# Patient Record
Sex: Male | Born: 1985 | Race: Black or African American | Hispanic: No | Smoking: Never smoker
Health system: Southern US, Community
[De-identification: ages and names within clinical notes are randomized; demographics above are authoritative.]

## PROBLEM LIST (undated history)

## (undated) DIAGNOSIS — I472 Ventricular tachycardia, unspecified: Secondary | ICD-10-CM

## (undated) DIAGNOSIS — I509 Heart failure, unspecified: Secondary | ICD-10-CM

---
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
## 2012-03-20 ENCOUNTER — Emergency Department (HOSPITAL_COMMUNITY): Payer: Self-pay

## 2012-03-20 ENCOUNTER — Encounter (HOSPITAL_COMMUNITY)
Admission: EM | Disposition: A | Discharge status: Still In Hospital | Payer: Self-pay | Source: Home / Self Care | Attending: Emergency Medicine

## 2012-03-20 ENCOUNTER — Encounter (HOSPITAL_COMMUNITY): Payer: Self-pay | Admitting: Emergency Medicine

## 2012-03-20 ENCOUNTER — Emergency Department (HOSPITAL_COMMUNITY): Payer: Self-pay | Admitting: Anesthesiology

## 2012-03-20 ENCOUNTER — Encounter (HOSPITAL_COMMUNITY): Payer: Self-pay | Admitting: Anesthesiology

## 2012-03-20 ENCOUNTER — Emergency Department (HOSPITAL_COMMUNITY)
Admission: EM | Admit: 2012-03-20 | Discharge: 2012-03-20 | Disposition: A | Discharge status: Still In Hospital | Payer: Self-pay | Attending: Emergency Medicine | Admitting: Emergency Medicine

## 2012-03-20 DIAGNOSIS — S62639B Displaced fracture of distal phalanx of unspecified finger, initial encounter for open fracture: Secondary | ICD-10-CM | POA: Insufficient documentation

## 2012-03-20 DIAGNOSIS — IMO0002 Reserved for concepts with insufficient information to code with codable children: Secondary | ICD-10-CM | POA: Insufficient documentation

## 2012-03-20 DIAGNOSIS — W540XXA Bitten by dog, initial encounter: Secondary | ICD-10-CM | POA: Insufficient documentation

## 2012-03-20 HISTORY — PX: I&D EXTREMITY: SHX5045

## 2012-03-20 HISTORY — PX: AMPUTATION: SHX166

## 2012-03-20 SURGERY — AMPUTATION DIGIT
Anesthesia: General | Site: Hand | Laterality: Left | Wound class: Contaminated

## 2012-03-20 MED ORDER — SODIUM CHLORIDE 0.9 % IV SOLN
3.0000 g | Freq: Once | INTRAVENOUS | Status: AC
  Administered 2012-03-20: 3 g via INTRAVENOUS
  Filled 2012-03-20: qty 3

## 2012-03-20 MED ORDER — PROMETHAZINE HCL 25 MG/ML IJ SOLN: 6.2500 mg | INTRAMUSCULAR | Status: DC | PRN

## 2012-03-20 MED ORDER — HYDROMORPHONE HCL PF 1 MG/ML IJ SOLN: 0.2500 mg | INTRAMUSCULAR | Status: DC | PRN

## 2012-03-20 MED ORDER — MIDAZOLAM HCL 5 MG/5ML IJ SOLN
INTRAMUSCULAR | Status: DC | PRN
  Administered 2012-03-20: 2 mg via INTRAVENOUS

## 2012-03-20 MED ORDER — FENTANYL CITRATE 0.05 MG/ML IJ SOLN
INTRAMUSCULAR | Status: DC | PRN
  Administered 2012-03-20: 50 ug via INTRAVENOUS
  Administered 2012-03-20: 100 ug via INTRAVENOUS

## 2012-03-20 MED ORDER — OXYCODONE HCL 5 MG/5ML PO SOLN: 5.0000 mg | Freq: Once | ORAL | Status: DC | PRN

## 2012-03-20 MED ORDER — OXYCODONE-ACETAMINOPHEN 5-325 MG PO TABS: ORAL_TABLET | ORAL | Status: DC

## 2012-03-20 MED ORDER — OXYCODONE HCL 5 MG PO TABS: 5.0000 mg | ORAL_TABLET | Freq: Once | ORAL | Status: DC | PRN

## 2012-03-20 MED ORDER — SODIUM CHLORIDE 0.9 % IR SOLN
Status: DC | PRN
  Administered 2012-03-20: 2000 mL

## 2012-03-20 MED ORDER — SODIUM CHLORIDE 0.9 % IV SOLN
Freq: Once | INTRAVENOUS | Status: AC
  Administered 2012-03-20: 10:00:00 via INTRAVENOUS

## 2012-03-20 MED ORDER — PROPOFOL 10 MG/ML IV BOLUS
INTRAVENOUS | Status: DC | PRN
  Administered 2012-03-20: 300 mg via INTRAVENOUS

## 2012-03-20 MED ORDER — LACTATED RINGERS IV SOLN: INTRAVENOUS | Status: DC

## 2012-03-20 MED ORDER — LIDOCAINE HCL (CARDIAC) 20 MG/ML IV SOLN
INTRAVENOUS | Status: DC | PRN
  Administered 2012-03-20: 100 mg via INTRAVENOUS

## 2012-03-20 MED ORDER — MEPERIDINE HCL 25 MG/ML IJ SOLN: 6.2500 mg | INTRAMUSCULAR | Status: DC | PRN

## 2012-03-20 MED ORDER — BUPIVACAINE HCL 0.25 % IJ SOLN
10.0000 mL | Freq: Once | INTRAMUSCULAR | Status: DC
  Filled 2012-03-20: qty 10

## 2012-03-20 MED ORDER — LACTATED RINGERS IV SOLN
INTRAVENOUS | Status: DC | PRN
  Administered 2012-03-20: 12:00:00 via INTRAVENOUS

## 2012-03-20 MED ORDER — MIDAZOLAM HCL 2 MG/2ML IJ SOLN: 0.5000 mg | Freq: Once | INTRAMUSCULAR | Status: DC | PRN

## 2012-03-20 MED ORDER — ONDANSETRON 4 MG PO TBDP
ORAL_TABLET | ORAL | Status: AC
  Filled 2012-03-20: qty 2

## 2012-03-20 MED ORDER — AMOXICILLIN-POT CLAVULANATE 875-125 MG PO TABS: 1.0000 | ORAL_TABLET | Freq: Two times a day (BID) | ORAL | Status: DC

## 2012-03-20 MED ORDER — BUPIVACAINE HCL 0.25 % IJ SOLN
INTRAMUSCULAR | Status: DC | PRN
  Administered 2012-03-20: 10 mL

## 2012-03-20 MED ORDER — ONDANSETRON 4 MG PO TBDP
8.0000 mg | ORAL_TABLET | Freq: Once | ORAL | Status: AC
  Administered 2012-03-20: 8 mg via ORAL

## 2012-03-20 MED ORDER — ONDANSETRON HCL 4 MG/2ML IJ SOLN
INTRAMUSCULAR | Status: DC | PRN
  Administered 2012-03-20: 4 mg via INTRAVENOUS

## 2012-03-20 SURGICAL SUPPLY — 67 items
BANDAGE COBAN STERILE 2 (GAUZE/BANDAGES/DRESSINGS) IMPLANT
BANDAGE CONFORM 2  STR LF (GAUZE/BANDAGES/DRESSINGS) ×2 IMPLANT
BANDAGE ELASTIC 3 VELCRO ST LF (GAUZE/BANDAGES/DRESSINGS) ×2 IMPLANT
BANDAGE ELASTIC 4 VELCRO ST LF (GAUZE/BANDAGES/DRESSINGS) ×2 IMPLANT
BANDAGE GAUZE ELAST BULKY 4 IN (GAUZE/BANDAGES/DRESSINGS) ×2 IMPLANT
BLADE LONG MED 31X9 (MISCELLANEOUS) ×2 IMPLANT
BNDG COHESIVE 1X5 TAN STRL LF (GAUZE/BANDAGES/DRESSINGS) ×2 IMPLANT
BNDG COHESIVE 3X5 TAN STRL LF (GAUZE/BANDAGES/DRESSINGS) IMPLANT
BNDG ESMARK 4X9 LF (GAUZE/BANDAGES/DRESSINGS) ×2 IMPLANT
CLOTH BEACON ORANGE TIMEOUT ST (SAFETY) ×2 IMPLANT
CORDS BIPOLAR (ELECTRODE) ×2 IMPLANT
COVER SURGICAL LIGHT HANDLE (MISCELLANEOUS) ×2 IMPLANT
CUFF TOURNIQUET SINGLE 18IN (TOURNIQUET CUFF) ×2 IMPLANT
CUFF TOURNIQUET SINGLE 24IN (TOURNIQUET CUFF) IMPLANT
DECANTER SPIKE VIAL GLASS SM (MISCELLANEOUS) ×2 IMPLANT
DRAIN PENROSE 1/4X12 LTX STRL (WOUND CARE) IMPLANT
DRSG ADAPTIC 3X8 NADH LF (GAUZE/BANDAGES/DRESSINGS) IMPLANT
DRSG EMULSION OIL 3X3 NADH (GAUZE/BANDAGES/DRESSINGS) ×2 IMPLANT
DRSG KUZMA FLUFF (GAUZE/BANDAGES/DRESSINGS) IMPLANT
DRSG PAD ABDOMINAL 8X10 ST (GAUZE/BANDAGES/DRESSINGS) ×4 IMPLANT
DURAPREP 26ML APPLICATOR (WOUND CARE) ×2 IMPLANT
GAUZE XEROFORM 1X8 LF (GAUZE/BANDAGES/DRESSINGS) ×2 IMPLANT
GLOVE BIO SURGEON STRL SZ 6.5 (GLOVE) ×2 IMPLANT
GLOVE BIO SURGEON STRL SZ7.5 (GLOVE) ×2 IMPLANT
GLOVE BIOGEL PI IND STRL 8 (GLOVE) ×1 IMPLANT
GLOVE BIOGEL PI INDICATOR 8 (GLOVE) ×1
GLOVE SURG ORTHO 8.0 STRL STRW (GLOVE) ×2 IMPLANT
GOWN BRE IMP PREV XXLGXLNG (GOWN DISPOSABLE) ×2 IMPLANT
GOWN PREVENTION PLUS XLARGE (GOWN DISPOSABLE) ×2 IMPLANT
GOWN STRL NON-REIN LRG LVL3 (GOWN DISPOSABLE) ×4 IMPLANT
HANDPIECE INTERPULSE COAX TIP (DISPOSABLE)
KIT BASIN OR (CUSTOM PROCEDURE TRAY) ×2 IMPLANT
KIT ROOM TURNOVER OR (KITS) ×2 IMPLANT
LOOP VESSEL MAXI BLUE (MISCELLANEOUS) IMPLANT
LOOP VESSEL MINI RED (MISCELLANEOUS) ×2 IMPLANT
MANIFOLD NEPTUNE II (INSTRUMENTS) ×2 IMPLANT
NEEDLE HYPO 25GX1X1/2 BEV (NEEDLE) ×2 IMPLANT
NEEDLE HYPO 25X1 1.5 SAFETY (NEEDLE) IMPLANT
NS IRRIG 1000ML POUR BTL (IV SOLUTION) ×2 IMPLANT
PACK ORTHO EXTREMITY (CUSTOM PROCEDURE TRAY) ×2 IMPLANT
PAD ARMBOARD 7.5X6 YLW CONV (MISCELLANEOUS) ×4 IMPLANT
PAD CAST 4YDX4 CTTN HI CHSV (CAST SUPPLIES) IMPLANT
PADDING CAST COTTON 4X4 STRL (CAST SUPPLIES)
RUBBERBAND STERILE (MISCELLANEOUS) ×2 IMPLANT
SCRUB BETADINE 4OZ XXX (MISCELLANEOUS) ×2 IMPLANT
SET HNDPC FAN SPRY TIP SCT (DISPOSABLE) IMPLANT
SOLUTION BETADINE 4OZ (MISCELLANEOUS) ×2 IMPLANT
SPECIMEN JAR SMALL (MISCELLANEOUS) ×2 IMPLANT
SPONGE GAUZE 4X4 12PLY (GAUZE/BANDAGES/DRESSINGS) ×2 IMPLANT
SPONGE LAP 18X18 X RAY DECT (DISPOSABLE) ×2 IMPLANT
SPONGE LAP 4X18 X RAY DECT (DISPOSABLE) ×2 IMPLANT
SPONGE SCRUB IODOPHOR (GAUZE/BANDAGES/DRESSINGS) ×2 IMPLANT
SUCTION FRAZIER TIP 10 FR DISP (SUCTIONS) ×2 IMPLANT
SUT ETHILON 4 0 PS 2 18 (SUTURE) ×2 IMPLANT
SUT ETHILON 5 0 PS 2 18 (SUTURE) IMPLANT
SUT MON AB 5-0 P3 18 (SUTURE) IMPLANT
SUT SILK 4 0 PS 2 (SUTURE) IMPLANT
SUT VICRYL 4-0 PS2 18IN ABS (SUTURE) IMPLANT
SYR CONTROL 10ML LL (SYRINGE) ×2 IMPLANT
TOWEL OR 17X24 6PK STRL BLUE (TOWEL DISPOSABLE) ×2 IMPLANT
TOWEL OR 17X26 10 PK STRL BLUE (TOWEL DISPOSABLE) ×2 IMPLANT
TUBE ANAEROBIC SPECIMEN COL (MISCELLANEOUS) IMPLANT
TUBE CONNECTING 12X1/4 (SUCTIONS) ×2 IMPLANT
TUBE FEEDING 5FR 15 INCH (TUBING) IMPLANT
UNDERPAD 30X30 INCONTINENT (UNDERPADS AND DIAPERS) ×2 IMPLANT
WATER STERILE IRR 1000ML POUR (IV SOLUTION) ×2 IMPLANT
YANKAUER SUCT BULB TIP NO VENT (SUCTIONS) ×2 IMPLANT

## 2012-03-20 NOTE — Transfer of Care (Signed)
Immediate Anesthesia Transfer of Care Note  Patient: Dakota Weber  Procedure(s) Performed: Procedure(s): IRRIGATION AND DEBRIDEMENT REVISION AMPUTATION LEFT FINGER (Left) IRRIGATION LEFT LONG FINGER (Left)  Patient Location: PACU  Anesthesia Type:General  Level of Consciousness: awake, alert  and oriented  Airway & Oxygen Therapy: Patient Spontanous Breathing  Post-op Assessment: Report given to PACU RN and Post -op Vital signs reviewed and stable  Post vital signs: Reviewed and stable  Complications: No apparent anesthesia complications

## 2012-03-20 NOTE — ED Provider Notes (Signed)
History     CSN: [id number]  Arrival date & time 03/20/12  4098   First MD Initiated Contact with Patient 03/20/12 [id number]      Chief Complaint  Patient presents with  . Finger Injury    (Consider location/radiation/quality/duration/timing/severity/associated sxs/prior treatment) HPI Dakota Weber is a 27 y.o. male who presents to ED with complaint of left index finger injury. States finger was bit by a dog. States knows who dog belongs to, and it is in possession. Pt states his tetanus has been within 10 years. Tip of the finger amputated by the bite, here with pt on ice. Pt denies any other injuries. States pain in the finger. Bleeding controlled. States feels dizzy and nauseated.    History reviewed. No pertinent past medical history.  History reviewed. No pertinent past surgical history.  No family history on file.  History  Substance Use Topics  . Smoking status: Not on file  . Smokeless tobacco: Not on file  . Alcohol Use: No      Review of Systems  Constitutional: Negative for fever and chills.  Skin: Positive for wound.  Hematological: Does not bruise/bleed easily.  All other systems reviewed and are negative.    Allergies  Review of patient's allergies indicates no known allergies.  Home Medications  No current outpatient prescriptions on file.  BP 136/83  Pulse 89  Temp(Src) 97.5 F (36.4 C) (Oral)  Resp 18  SpO2 100%  Physical Exam  Nursing note and vitals reviewed. Constitutional: He appears well-developed and well-nourished. No distress.  Eyes: Conjunctivae are normal.  Neck: Neck supple.  Musculoskeletal:  Amputation to the distal left index finger, just proximal to the finger nail. Bleeding controlled with pressure. Left middle finger puncture wound through the finger nail with a small paraungual laceration.  Neurological: He is alert.  Skin: Skin is warm and dry.    ED Course  Procedures (including critical care time)  NERVE  BLOCK Performed by: Jaynie Crumble A Consent: Verbal consent obtained. Required items: required blood products, implants, devices, and special equipment available Time out: Immediately prior to procedure a "time out" was called to verify the correct patient, procedure, equipment, support staff and site/side marked as required.  Indication: partial amputation, pain management Nerve block body site: left index finger  Preparation: Patient was prepped and draped in the usual sterile fashion. Needle gauge: 24 G Location technique: anatomical landmarks  Local anesthetic: lidocaine 2% wo epi  Anesthetic total: 4 ml  Outcome: pain improved Patient tolerance: Patient tolerated the procedure well with no immediate complications.   10:50 AM Pt seen by Dr. Merlyn Lot, will take to OR at 12:30. Pt's pain controlled at present. Bleeding controlled with pressure dressing.   1. Traumatic amputation of finger(s), complicated, initial encounter   2. Dog bite, initial encounter       MDM  PT with traumatic dog bite amputation of the distal phalanx of left index finger and a wound to the distal left middle finger. Pt's tetanus within last 10 years. Dog in possession per family, no rabies started. Pt given unasyn in ED.  Pt will be taken to OR by Dr. Merlyn Lot.         Lottie Mussel, PA-C 03/20/12 1054

## 2012-03-20 NOTE — Brief Op Note (Signed)
03/20/2012  1:33 PM  PATIENT:  Dakota Weber  27 y.o. male  PRE-OPERATIVE DIAGNOSIS:  dog bite  POST-OPERATIVE DIAGNOSIS:  dog bite  PROCEDURE:  Procedure(s): IRRIGATION AND DEBRIDEMENT REVISION AMPUTATION LEFT FINGER (Left) IRRIGATION LEFT LONG FINGER (Left)  SURGEON:  Surgeon(s) and Role:    * Tami Ribas, MD - Primary  PHYSICIAN ASSISTANT:   ASSISTANTS: none   ANESTHESIA:   general  EBL:     BLOOD ADMINISTERED:none  DRAINS: vessel loop drains  LOCAL MEDICATIONS USED:  MARCAINE     SPECIMEN:  No Specimen  DISPOSITION OF SPECIMEN:  N/A  COUNTS:  YES  TOURNIQUET:   Total Tourniquet Time Documented: Upper Arm (Left) - 38 minutes Total: Upper Arm (Left) - 38 minutes   DICTATION: .Other Dictation: Dictation Number [id number]  PLAN OF CARE: Discharge to home after PACU  PATIENT DISPOSITION:  PACU - hemodynamically stable.

## 2012-03-20 NOTE — H&P (Signed)
  Dakota Weber is an 27 y.o. male.   Chief Complaint: dog bite left hand HPI: 27 yo male states he was bitten by a neighbors dog this morning.  Unsure if dog has had its shots.  Suffered loss of tip of left index finger and wound to distal phalanx of long finger.  Reports no other injuries.  Pain controlled.  NPO today.  History reviewed. No pertinent past medical history.  History reviewed. No pertinent past surgical history.  No family history on file. Social History:  reports that he does not drink alcohol or use illicit drugs. His tobacco history is not on file.  Allergies: No Known Allergies   (Not in a hospital admission)  No results found for this or any previous visit (from the past 48 hour(s)).  Dg Finger Index Left  03/20/2012  *RADIOLOGY REPORT*  Clinical Data: Distal aspect of the left index finger and off by dog today.  LEFT INDEX FINGER 2+V  Comparison: No priors.  Findings: There is traumatic amputation of the distal aspect of the distal phalanx of the left second finger.  Overlying soft tissues are also missing, and there are several small bony fragments in the overlying soft tissues.   The finger is otherwise intact.  IMPRESSION: 1.  Open comminuted fracture/amputation of the distal aspect of the left second distal phalanx.   Original Report Authenticated By: Trudie Reed, M.D.      A comprehensive review of systems was negative.  Blood pressure 110/86, pulse 65, temperature 97.5 F (36.4 C), temperature source Oral, resp. rate 20, SpO2 100.00%.  General appearance: alert, cooperative and appears stated age Head: Normocephalic, without obvious abnormality, atraumatic Neck: supple, symmetrical, trachea midline Resp: clear to auscultation bilaterally Cardio: regular rate and rhythm GI: non tender Extremities: intact sensation and capillary refill all digits.  +epl/fpl/io.  right UE: no wounds or ttp.  left UE: amputation of tip of index through mid nail.  wound at  radial side of distal phalanx of long finger.  nail intact.  swelling in pad of finger.  no other wounds. Pulses: 2+ and symmetric Skin: as above Neurologic: Grossly normal Incision/Wound: As above  Assessment/Plan Left index amputation and long finger wound from dog bite.  Recommend OR for I&D and revision amputation left index and I&D of long finger.  Risks, benefits, and alternatives of surgery were discussed and the patient agrees with the plan of care.   KUZMA,KEVIN R 03/20/2012, 11:10 AM

## 2012-03-20 NOTE — ED Notes (Signed)
Pt. Stated, I was bitten by a dog trying to retrieve my own dog .  Bite the tip end of my lt. Index finger.

## 2012-03-20 NOTE — Preoperative (Signed)
Beta Blockers   Reason not to administer Beta Blockers:Not Applicable 

## 2012-03-20 NOTE — Anesthesia Procedure Notes (Signed)
Procedure Name: LMA Insertion Date/Time: 03/20/2012 12:31 PM Performed by: Marena Chancy Pre-anesthesia Checklist: Timeout performed, Patient identified, Emergency Drugs available, Suction available and Patient being monitored Patient Re-evaluated:Patient Re-evaluated prior to inductionOxygen Delivery Method: Circle system utilized Preoxygenation: Pre-oxygenation with 100% oxygen Intubation Type: IV induction LMA: LMA inserted LMA Size: 5.0 Number of attempts: 1 Tube secured with: Tape Dental Injury: Teeth and Oropharynx as per pre-operative assessment

## 2012-03-20 NOTE — ED Provider Notes (Signed)
Medical screening examination/treatment/procedure(s) were conducted as a shared visit with non-physician practitioner(s) and myself.  I personally evaluated the patient during the encounter.  Open Distal phalanx tuft fracture with fingertip amputation from dog bite; hand surgery will see the patient in ED.  Dog known on chain and can be quarantined.  Hurman Horn, MD 03/20/12 2118

## 2012-03-20 NOTE — ED Notes (Signed)
Patient transported to X-ray 

## 2012-03-20 NOTE — ED Notes (Signed)
Pt transported to OR

## 2012-03-20 NOTE — Op Note (Signed)
[id number] 

## 2012-03-20 NOTE — ED Notes (Signed)
Per Registration, dog bite has been reported. Pt is waiting for surgery around 1230. Consent has been signed. Pt is resting in room, using  Cell phone/watching TV

## 2012-03-21 NOTE — Op Note (Signed)
NAMEDIGBY, GROENEVELD NO.:  [id number]  MEDICAL RECORD NO.:  [id number]  LOCATION:  [facility]                         FACILITY:  [facility]  PHYSICIAN:  Betha Loa, MD        DATE OF BIRTH:  [birth year]  DATE OF PROCEDURE:  03/20/2012 DATE OF DISCHARGE:  03/20/2012                              OPERATIVE REPORT   PREOPERATIVE DIAGNOSIS:  Left index finger amputation and long finger wound due to dog bite.  POSTOPERATIVE DIAGNOSIS:  Left index finger amputation due to dog bite and long finger nail bed laceration due to dog bite.  PROCEDURES:  Left index finger irrigation and debridement and revision amputation, left long finger irrigation and debridement, and repair of nail bed.  SURGEON:  Betha Loa, MD  ASSISTANT:  None.  ANESTHESIA:  General.  IV FLUIDS:  Per anesthesia flow sheet.  ESTIMATED BLOOD LOSS:  Minimal.  COMPLICATIONS:  None.  SPECIMENS:  None.  TOURNIQUET TIME:  38 minutes.  DISPOSITION:  Stable to PACU.  INDICATIONS:  Mr. Ferrell is a 27 year old male who states this morning while he was trying to get his dog in from outside, a dog that was chained up bit him on his left hand.  This caused amputation of the tip of the index finger and small wound on the long finger.  He presented to [facility].  I was consulted for management injury. On evaluation, he had complete amputation of the tip of the index finger through the midportion of the nail.  There was a small laceration of the cuticle at the radial side of the nail of the long finger.  I recommended Mr. Hohn going to the operating room for irrigation and debridement, revision amputation, and closure of the wounds.  Risks, benefits, and alternatives of surgery were discussed including risk of blood loss, infection, damage to nerves, vessels, tendons, ligaments, bone; failure of surgery; need for additional surgery, complications with wound healing, continued pain,  infection, and need for repeat irrigation and debridement.  He voiced understanding of these risks and elected to proceed.  OPERATIVE COURSE:  After being identified preoperatively by myself, the patient and I agreed upon procedure and site procedure.  The surgical site was marked.  The risks, benefits, and alternatives of surgery were reviewed and he wished to proceed.  Surgical consent had been signed. He was given IV Unasyn in the emergency department for coverage of the wound.  His tetanus was within the past 6 years.  He was transferred to the operating room and placed on the operating room table in supine position with the left upper extremity on arm board.  General anesthesia was induced by the anesthesiologist.  Left upper extremity was prepped and draped in normal sterile orthopedic fashion.  Surgical pause was performed between surgeons, anesthesia, operating staff, and all were in agreement as to the patient, procedure, and site of procedure. Tourniquet at the proximal aspect of the extremity was inflated to 250 mmHg after exsanguination of the limb with an Esmarch bandage. Attention was turned to the index finger 1st.  There was no gross contamination.  There was some comminution to the distal phalanx.  The  wound was copiously irrigated with 1000 mL of sterile saline by bulb syringe.  There was a split of the nail bed.  The nail had avulsed due to the traumatic injury.  The comminuted portion of the distal phalanx was on the ulnar side and this was removed.  The soft tissues were freed up using the scalpel.  A 5-0 Monocryl suture was used to approximate the soft tissues over the end of the bone.  A 6-0 chromic suture was used to repair the nail bed laceration proximally the fatty soft tissues up into the nail bed tissue.  Vessel loop drains were placed in the wounds to provide adequate drainage since this was a dog bite injury.  Attention was then turned to the long finger.   The wound on the side of the cuticle was an abrasion mostly.  The nail fold was lifted and there was no wound underneath.  There was a hematoma underneath the nail, but had enlarged since his initial evaluation.  The nail was then removed with the Therapist, nutritional.  There was a laceration of the nail bed on the radial side.  This was a longitudinal with a T-portion distally.  This laceration was copiously irrigated with sterile saline.  A relaxing incision had been made in the radial side of the cuticle as well.  The 6- 0 chromic suture was used to repair the nail bed laceration.  This approximated the nail bed tissues well.  The chromic sutures were also used to repair the relaxing incision in the cuticle.  Xeroform was placed in the nail fold and all wounds were dressed with sterile Xeroform, 4x4s, and wrapped with a Coban dressing lightly.  An AlumaFoam splint was placed on the index finger and wrapped with a Coban dressing lightly.  Digital block was performed to the index and long finger using 10 mL of 0.25% plain Marcaine to aid in postoperative analgesia.  The tourniquet was deflated at 38 minutes.  The operative drapes were broken down.  The patient was awoken from anesthesia safely.  He was transferred back to stretcher and taken to PACU in stable condition.  I will see him back in the office in 4 days for postoperative followup.  I will give him Percocet 5/325, 1-2 p.o. q.6 hours p.r.n. pain, dispensed #40; and Augmentin 875 mg p.o. b.i.d. x7 days.     Betha Loa, MD     KK/MEDQ  D:  03/20/2012  T:  03/21/2012  Job:  [id number]

## 2012-03-21 NOTE — Anesthesia Preprocedure Evaluation (Addendum)
Anesthesia Evaluation  Patient identified by MRN, date of birth, ID band Patient awake    Reviewed: Allergy & Precautions, H&P , NPO status , Patient's Chart, lab work & pertinent test results, reviewed documented beta blocker date and time   History of Anesthesia Complications Negative for: history of anesthetic complications  Airway Mallampati: I TM Distance: >3 FB Neck ROM: Full    Dental no notable dental hx. (+) Dental Advisory Given   Pulmonary neg pulmonary ROS,    Pulmonary exam normal       Cardiovascular negative cardio ROS  IRhythm:regular Rate:Normal     Neuro/Psych negative neurological ROS  negative psych ROS   GI/Hepatic negative GI ROS, Neg liver ROS,   Endo/Other  negative endocrine ROS  Renal/GU negative Renal ROS  negative genitourinary   Musculoskeletal   Abdominal   Peds  Hematology negative hematology ROS (+)   Anesthesia Other Findings   Reproductive/Obstetrics negative OB ROS                          Anesthesia Physical Anesthesia Plan  ASA: I  Anesthesia Plan: General   Post-op Pain Management:    Induction: Intravenous  Airway Management Planned: LMA  Additional Equipment:   Intra-op Plan:   Post-operative Plan:   Informed Consent: I have reviewed the patients History and Physical, chart, labs and discussed the procedure including the risks, benefits and alternatives for the proposed anesthesia with the patient or authorized representative who has indicated his/her understanding and acceptance.   Dental advisory given  Plan Discussed with: CRNA, Surgeon and Anesthesiologist  Anesthesia Plan Comments:        Anesthesia Quick Evaluation

## 2012-03-21 NOTE — Addendum Note (Signed)
Addendum created 03/21/12 1439 by Marena Chancy, CRNA   Modules edited: Clinical Notes   Clinical Notes:  File: [id number]

## 2012-03-21 NOTE — Anesthesia Postprocedure Evaluation (Signed)
  Anesthesia Post-op Note  Patient: Dakota Weber  Procedure(s) Performed: Procedure(s): IRRIGATION AND DEBRIDEMENT REVISION AMPUTATION LEFT FINGER (Left) IRRIGATION LEFT LONG FINGER (Left)  Patient Location: PACU  Anesthesia Type:General  Level of Consciousness: awake  Airway and Oxygen Therapy: Patient Spontanous Breathing  Post-op Pain: none  Post-op Assessment: Post-op Vital signs reviewed  Post-op Vital Signs: stable  Complications: No apparent anesthesia complications

## 2012-03-24 ENCOUNTER — Encounter (HOSPITAL_COMMUNITY): Payer: Self-pay | Admitting: Orthopedic Surgery

## 2013-06-23 ENCOUNTER — Encounter (HOSPITAL_COMMUNITY): Payer: Self-pay | Admitting: Emergency Medicine

## 2013-06-23 ENCOUNTER — Emergency Department (HOSPITAL_COMMUNITY)
Admission: EM | Admit: 2013-06-23 | Discharge: 2013-06-23 | Disposition: A | Discharge status: Home / Self Care | Payer: Self-pay | Source: Home / Self Care | Attending: Family Medicine | Admitting: Family Medicine

## 2013-06-23 DIAGNOSIS — B029 Zoster without complications: Secondary | ICD-10-CM

## 2013-06-23 MED ORDER — ACYCLOVIR 800 MG PO TABS: 800.0000 mg | ORAL_TABLET | Freq: Every day | ORAL | Status: DC

## 2013-06-23 MED ORDER — TRAMADOL HCL 50 MG PO TABS: 50.0000 mg | ORAL_TABLET | Freq: Four times a day (QID) | ORAL | Status: DC | PRN

## 2013-06-23 NOTE — Discharge Instructions (Signed)
Thank you for coming in today. Take acyclovir 5 times daily for 10 days Use tramadol for severe pain. Do not take at work or while driving You can take Tylenol or ibuprofen for pain at work Come back as needed   Shingles Shingles (herpes zoster) is an infection that is caused by the same virus that causes chickenpox (varicella). The infection causes a painful skin rash and fluid-filled blisters, which eventually break open, crust over, and heal. It may occur in any area of the body, but it usually affects only one side of the body or face. The pain of shingles usually lasts about 1 month. However, some people with shingles may develop long-term (chronic) pain in the affected area of the body. Shingles often occurs many years after the person had chickenpox. It is more common:  In people older than 50 years.  In people with weakened immune systems, such as those with HIV, AIDS, or cancer.  In people taking medicines that weaken the immune system, such as transplant medicines.  In people under great stress. CAUSES  Shingles is caused by the varicella zoster virus (VZV), which also causes chickenpox. After a person is infected with the virus, it can remain in the person's body for years in an inactive state (dormant). To cause shingles, the virus reactivates and breaks out as an infection in a nerve root. The virus can be spread from person to person (contagious) through contact with open blisters of the shingles rash. It will only spread to people who have not had chickenpox. When these people are exposed to the virus, they may develop chickenpox. They will not develop shingles. Once the blisters scab over, the person is no longer contagious and cannot spread the virus to others. SYMPTOMS  Shingles shows up in stages. The initial symptoms may be pain, itching, and tingling in an area of the skin. This pain is usually described as burning, stabbing, or throbbing.In a few days or weeks, a painful  red rash will appear in the area where the pain, itching, and tingling were felt. The rash is usually on one side of the body in a band or belt-like pattern. Then, the rash usually turns into fluid-filled blisters. They will scab over and dry up in approximately 2 3 weeks. Flu-like symptoms may also occur with the initial symptoms, the rash, or the blisters. These may include:  Fever.  Chills.  Headache.  Upset stomach. DIAGNOSIS  Your caregiver will perform a skin exam to diagnose shingles. Skin scrapings or fluid samples may also be taken from the blisters. This sample will be examined under a microscope or sent to a lab for further testing. TREATMENT  There is no specific cure for shingles. Your caregiver will likely prescribe medicines to help you manage the pain, recover faster, and avoid long-term problems. This may include antiviral drugs, anti-inflammatory drugs, and pain medicines. HOME CARE INSTRUCTIONS   Take a cool bath or apply cool compresses to the area of the rash or blisters as directed. This may help with the pain and itching.   Only take over-the-counter or prescription medicines as directed by your caregiver.   Rest as directed by your caregiver.  Keep your rash and blisters clean with mild soap and cool water or as directed by your caregiver.  Do not pick your blisters or scratch your rash. Apply an anti-itch cream or numbing creams to the affected area as directed by your caregiver.  Keep your shingles rash covered with a loose  bandage (dressing).  Avoid skin contact with:  Babies.   Pregnant women.   Children with eczema.   Elderly people with transplants.   People with chronic illnesses, such as leukemia or AIDS.   Wear loose-fitting clothing to help ease the pain of material rubbing against the rash.  Keep all follow-up appointments with your caregiver.If the area involved is on your face, you may receive a referral for follow-up to a  specialist, such as an eye doctor (ophthalmologist) or an ear, nose, and throat (ENT) doctor. Keeping all follow-up appointments will help you avoid eye complications, chronic pain, or disability.  SEEK IMMEDIATE MEDICAL CARE IF:   You have facial pain, pain around the eye area, or loss of feeling on one side of your face.  You have ear pain or ringing in your ear.  You have loss of taste.  Your pain is not relieved with prescribed medicines.   Your redness or swelling spreads.   You have more pain and swelling.  Your condition is worsening or has changed.   You have a feveror persistent symptoms for more than 2 3 days.  You have a fever and your symptoms suddenly get worse. MAKE SURE YOU:  Understand these instructions.  Will watch your condition.  Will get help right away if you are not doing well or get worse. Document Released: 01/01/2005 Document Revised: 09/26/2011 Document Reviewed: 08/16/2011 [facility] Patient Information 2014 [place], [place].

## 2013-06-23 NOTE — ED Provider Notes (Signed)
Mattheu Pointer is a 28 y.o. male who presents to Urgent Care today for rash and chest pain. Patient developed a rash on his right trunk associated with pain over the last three days. He denies any fevers or chills nausea vomiting or diarrhea. He denies any trouble breathing. He notes that he did not have chickenpox as a child but has been vaccinated. No treatments tried yet.   History reviewed. No pertinent past medical history. History  Substance Use Topics  . Smoking status: Never Smoker   . Smokeless tobacco: Not on file  . Alcohol Use: No   ROS as above Medications: No current facility-administered medications for this encounter.   Current Outpatient Prescriptions  Medication Sig Dispense Refill  . acyclovir (ZOVIRAX) 800 MG tablet Take 1 tablet (800 mg total) by mouth 5 (five) times daily.  50 tablet  0  . traMADol (ULTRAM) 50 MG tablet Take 1 tablet (50 mg total) by mouth every 6 (six) hours as needed.  30 tablet  0    Exam:  BP 139/85  Pulse 60  Temp(Src) 98.1 F (36.7 C) (Oral)  Resp 18  SpO2 98% Gen: Well NAD HEENT: EOMI,  MMM Lungs: Normal work of breathing. CTABL Heart: RRR no MRG Abd: NABS, Soft. NT, ND Exts: Brisk capillary refill, warm and well perfused.  Skin: Vesicular rash on erythematous base in patches in the T11 dermatomal pattern on the right side of his trunk  No results found for this or any previous visit (from the past 24 hour(s)). No results found.  Assessment and Plan: 28 y.o. male with shingles. Plan for treatment with acyclovir and tramadol.  Discussed warning signs or symptoms. Please see discharge instructions. Patient expresses understanding.  Work note up to 7 days as needed  Rodolph Bong, MD 06/23/13 1024

## 2013-06-23 NOTE — ED Notes (Signed)
eruptions right side of chest w burning pain x couple of days

## 2016-02-18 ENCOUNTER — Encounter (HOSPITAL_COMMUNITY): Payer: Self-pay | Admitting: Oncology

## 2016-02-18 DIAGNOSIS — Z79899 Other long term (current) drug therapy: Secondary | ICD-10-CM | POA: Insufficient documentation

## 2016-02-18 DIAGNOSIS — Y999 Unspecified external cause status: Secondary | ICD-10-CM | POA: Insufficient documentation

## 2016-02-18 DIAGNOSIS — Z23 Encounter for immunization: Secondary | ICD-10-CM | POA: Insufficient documentation

## 2016-02-18 DIAGNOSIS — Y929 Unspecified place or not applicable: Secondary | ICD-10-CM | POA: Insufficient documentation

## 2016-02-18 DIAGNOSIS — Y939 Activity, unspecified: Secondary | ICD-10-CM | POA: Insufficient documentation

## 2016-02-18 DIAGNOSIS — S71152A Open bite, left thigh, initial encounter: Secondary | ICD-10-CM | POA: Insufficient documentation

## 2016-02-18 DIAGNOSIS — W540XXA Bitten by dog, initial encounter: Secondary | ICD-10-CM | POA: Insufficient documentation

## 2016-02-18 NOTE — ED Triage Notes (Signed)
Pt reports a dog bit, from a pit bull, to the left thigh.  Pt cleansed area w/ peroxide and applied a bandage.  Per pt the dog is a friend of a relative and has already been picked up by animal control.  Pt rates pain 2/10.

## 2016-02-19 ENCOUNTER — Emergency Department (HOSPITAL_COMMUNITY)
Admission: EM | Admit: 2016-02-19 | Discharge: 2016-02-19 | Disposition: A | Discharge status: Home / Self Care | Payer: Self-pay | Attending: Emergency Medicine | Admitting: Emergency Medicine

## 2016-02-19 DIAGNOSIS — W540XXA Bitten by dog, initial encounter: Secondary | ICD-10-CM

## 2016-02-19 MED ORDER — AMOXICILLIN-POT CLAVULANATE 875-125 MG PO TABS
1.0000 | ORAL_TABLET | Freq: Once | ORAL | Status: AC
  Administered 2016-02-19: 1 via ORAL
  Filled 2016-02-19: qty 1

## 2016-02-19 MED ORDER — AMOXICILLIN-POT CLAVULANATE 875-125 MG PO TABS: 1.0000 | ORAL_TABLET | Freq: Two times a day (BID) | ORAL | 0 refills | Status: DC

## 2016-02-19 MED ORDER — TETANUS-DIPHTH-ACELL PERTUSSIS 5-2.5-18.5 LF-MCG/0.5 IM SUSP
0.5000 mL | Freq: Once | INTRAMUSCULAR | Status: AC
  Administered 2016-02-19: 0.5 mL via INTRAMUSCULAR
  Filled 2016-02-19: qty 0.5

## 2016-02-19 NOTE — ED Notes (Signed)
Wound care completed per order to left inner thigh.

## 2016-02-19 NOTE — ED Provider Notes (Signed)
[facility]-EMERGENCY DEPT Provider Note   CSN: [id number] Arrival date & time: 02/18/16  2248  By signing my name below, I, Dakota Weber, attest that this documentation has been prepared under the direction and in the presence of non-physician practitioner, Antony Madura, PA-C. Electronically Signed: Modena Weber, Scribe. 02/19/2016. 1:05 AM.   History   Chief Complaint Chief Complaint  Patient presents with  . Animal Bite   The history is provided by the patient. No language interpreter was used.  Animal Bite  Contact animal:  Dog Location:  Leg Leg injury location:  L upper leg Pain details:    Severity:  Moderate   Timing:  Constant Notifications:  Animal control Animal's rabies vaccination status:  Up to date Animal in possession: yes   Tetanus status:  Unknown Relieved by:  Nothing  HPI Comments: Dakota Weber is a 31 y.o. male who presents to the Emergency Department complaining of a left thigh dog bite that occurred today. He states he knew the dog's owner who reported the dog's immunizations are UTD. Reports animal control was notified and they picked the dog. He applied peroxide and bandaged to associated wound PTA. He has associated pain to area. He is unsure of his tetanus status. He denies any other complaints.    History reviewed. No pertinent past medical history.  There are no active problems to display for this patient.   Past Surgical History:  Procedure Laterality Date  . 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 I&D EXTREMITY Left 03/20/2012   Procedure: IRRIGATION LEFT LONG FINGER;  Surgeon: Tami Ribas, MD;  Location: [facility] OR;  Service: Orthopedics;  Laterality: Left;       Home Medications    Prior to Admission medications   Medication Sig Start Date End Date Taking? Authorizing Provider  acyclovir  (ZOVIRAX) 800 MG tablet Take 1 tablet (800 mg total) by mouth 5 (five) times daily. 06/23/13   Rodolph Bong, MD  amoxicillin-clavulanate (AUGMENTIN) 875-125 MG tablet Take 1 tablet by mouth every 12 (twelve) hours. 02/19/16   Antony Madura, PA-C  traMADol (ULTRAM) 50 MG tablet Take 1 tablet (50 mg total) by mouth every 6 (six) hours as needed. 06/23/13   Rodolph Bong, MD    Family History No family history on file.  Social History Social History  Substance Use Topics  . Smoking status: Never Smoker  . Smokeless tobacco: Never Used  . Alcohol use No     Allergies   Patient has no known allergies.   Review of Systems Review of Systems A complete 10 system review of systems was obtained and all systems are negative except as noted in the HPI and PMH.    Physical Exam Updated Vital Signs BP 140/88 (BP Location: Left Arm)   Pulse 60   Temp 99 F (37.2 C) (Oral)   Resp 18   Ht 6\' 1"  (1.854 m)   Wt 252 lb (114.3 kg)   SpO2 98%   BMI 33.25 kg/m   Physical Exam  Constitutional: He is oriented to person, place, and time. He appears well-developed and well-nourished. No distress.  Nontoxic appearing and in NAD  HENT:  Head: Normocephalic and atraumatic.  Eyes: Conjunctivae and EOM are normal. No scleral icterus.  Neck: Normal range of motion.  Cardiovascular: Normal rate, regular rhythm and intact distal pulses.  DP pulse 2+ in the LLE  Pulmonary/Chest: Effort normal. No respiratory distress. He has no wheezes.  Respirations even and unlabored  Musculoskeletal: Normal range of motion.  Neurological: He is alert and oriented to person, place, and time. He exhibits normal muscle tone. Coordination normal.  Skin: Skin is warm and dry. No rash noted. He is not diaphoretic. No pallor.     3cm diameter wound to the mid anterior left thigh c/w dog bite. No induration or erythema. No purulent drainage or heat to touch.  Psychiatric: He has a normal mood and affect. His behavior is  normal.  Nursing note and vitals reviewed.    ED Treatments / Results  DIAGNOSTIC STUDIES: Oxygen Saturation is 98% on RA, normal by my interpretation.    COORDINATION OF CARE: 1:09 AM- Pt advised of plan for treatment and pt agrees.  Labs (all labs ordered are listed, but only abnormal results are displayed) Labs Reviewed - No data to display  EKG  EKG Interpretation None       Radiology No results found.  Procedures Procedures (including critical care time)  Medications Ordered in ED Medications  Tdap (BOOSTRIX) injection 0.5 mL (0.5 mLs Intramuscular Given 02/19/16 0139)  amoxicillin-clavulanate (AUGMENTIN) 875-125 MG per tablet 1 tablet (1 tablet Oral Given 02/19/16 0139)     Initial Impression / Assessment and Plan / ED Course  I have reviewed the triage vital signs and the nursing notes.  Pertinent labs & imaging results that were available during my care of the patient were reviewed by me and considered in my medical decision making (see chart for details).     31 year old male presents to the emergency department for evaluation of a wound consistent with a dog bite. Patient neurovascularly intact. No palpable, pulsatile bleeding. No exposed muscle. No indication for closure given mechanism of wound. Wound has been dressed in the emergency department. Tetanus updated and patient started on Augmentin. Dog is in the care of Animal Control; vaccines reportedly UTD. Will hold rabies vaccination. Supportive care advised and return precautions given. Patient discharged in stable condition with no unaddressed concerns.   Final Clinical Impressions(s) / ED Diagnoses   Final diagnoses:  Dog bite, initial encounter    New Prescriptions Discharge Medication List as of 02/19/2016  1:43 AM    START taking these medications   Details  amoxicillin-clavulanate (AUGMENTIN) 875-125 MG tablet Take 1 tablet by mouth every 12 (twelve) hours., Starting Sun 02/19/2016, Print        I personally performed the services described in this documentation, which was scribed in my presence. The recorded information has been reviewed and is accurate.       Antony Madura, PA-C 02/19/16 7680    Pricilla Loveless, MD 02/19/16 [phone]

## 2016-08-25 ENCOUNTER — Emergency Department (HOSPITAL_COMMUNITY)
Admission: EM | Admit: 2016-08-25 | Discharge: 2016-08-25 | Disposition: A | Discharge status: Home / Self Care | Payer: Self-pay | Attending: Emergency Medicine | Admitting: Emergency Medicine

## 2016-08-25 ENCOUNTER — Encounter (HOSPITAL_COMMUNITY): Payer: Self-pay | Admitting: Emergency Medicine

## 2016-08-25 ENCOUNTER — Emergency Department (HOSPITAL_COMMUNITY): Payer: Self-pay

## 2016-08-25 DIAGNOSIS — R072 Precordial pain: Secondary | ICD-10-CM | POA: Insufficient documentation

## 2016-08-25 DIAGNOSIS — R079 Chest pain, unspecified: Secondary | ICD-10-CM

## 2016-08-25 DIAGNOSIS — R002 Palpitations: Secondary | ICD-10-CM | POA: Insufficient documentation

## 2016-08-25 LAB — CBC
HEMATOCRIT: 44.2 % (ref 39.0–52.0)
HEMOGLOBIN: 15.1 g/dL (ref 13.0–17.0)
MCH: 25.3 pg — AB (ref 26.0–34.0)
MCHC: 34.2 g/dL (ref 30.0–36.0)
MCV: 74.2 fL — AB (ref 78.0–100.0)
PLATELETS: 222 10*3/uL (ref 150–400)
RBC: 5.96 MIL/uL — ABNORMAL HIGH (ref 4.22–5.81)
RDW: 14 % (ref 11.5–15.5)
WBC: 7.4 10*3/uL (ref 4.0–10.5)

## 2016-08-25 LAB — HEPATIC FUNCTION PANEL
ALBUMIN: 4.1 g/dL (ref 3.5–5.0)
ALK PHOS: 55 U/L (ref 38–126)
ALT: 17 U/L (ref 17–63)
AST: 21 U/L (ref 15–41)
Bilirubin, Direct: <0.1 mg/dL — ABNORMAL LOW (ref 0.1–0.5)
TOTAL PROTEIN: 8 g/dL (ref 6.5–8.1)
Total Bilirubin: 0.8 mg/dL (ref 0.3–1.2)

## 2016-08-25 LAB — BASIC METABOLIC PANEL
ANION GAP: 7 (ref 5–15)
BUN: 8 mg/dL (ref 6–20)
CHLORIDE: 106 mmol/L (ref 101–111)
CO2: 25 mmol/L (ref 22–32)
Calcium: 9.6 mg/dL (ref 8.9–10.3)
Creatinine, Ser: 1.04 mg/dL (ref 0.61–1.24)
GFR calc Af Amer: >60 mL/min (ref >60)
GFR calc non Af Amer: >60 mL/min (ref >60)
GLUCOSE: 97 mg/dL (ref 65–99)
POTASSIUM: 3.7 mmol/L (ref 3.5–5.1)
Sodium: 138 mmol/L (ref 135–145)

## 2016-08-25 LAB — I-STAT TROPONIN, ED
Troponin i, poc: 0.01 ng/mL (ref 0.00–0.08)
Troponin i, poc: 0.01 ng/mL (ref 0.00–0.08)

## 2016-08-25 NOTE — ED Triage Notes (Signed)
Pt to ED via [facility] from work with c/o chest pain that started last night while at home, states "I just feel heavy all over-- states unable to lift arms, legs, did drive self to work. Pt is alert/oriented x 4.

## 2016-08-25 NOTE — ED Notes (Signed)
Fiance at bedside

## 2016-08-25 NOTE — ED Notes (Signed)
Mother and family at bedside. Pt had what appeared to be panic attack pt hyperventilating. calmed pt down through breathing.

## 2016-08-25 NOTE — ED Notes (Signed)
Pt has been tearful when talking with mother- asked to have privacy to talk with mother-

## 2016-08-25 NOTE — ED Provider Notes (Signed)
[facility]-EMERGENCY DEPT Provider Note   CSN: [id number] Arrival date & time: 08/25/16  1324     History   Chief Complaint Chief Complaint  Patient presents with  . Chest Pain    HPI Dakota Weber is a 31 y.o. male.  Patient with no past cardiac history presents with complaint of chest pressure and a heavy feeling in his body that started after work yesterday. Patient states that he was trying to relax when symptoms started acutely at about 8 PM. He did not have any focal weakness but does describe a general feeling of heaviness in his body. This resolved after several minutes and patient was able to sleep. He awoke this morning and went to work. At approximately 7 AM symptoms returned. He felt like his heart was racing. No associated diaphoresis, vomiting. No exertional symptoms. No history of hypertension, high cholesterol, diabetes, smoking. No reported family history of heart disease in first-degree relatives. No recent viral syndrome type symptoms. Patient denies risk factors for pulmonary embolism including: unilateral leg swelling, history of DVT/PE/other blood clots, use of exogenous hormones, recent immobilizations, recent surgery, recent travel (>4hr segment), malignancy, hemoptysis.        History reviewed. No pertinent past medical history.  There are no active problems to display for this patient.   Past Surgical History:  Procedure Laterality Date  . 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 I&D EXTREMITY Left 03/20/2012   Procedure: IRRIGATION LEFT LONG FINGER;  Surgeon: Tami Ribas, MD;  Location: [facility] OR;  Service: Orthopedics;  Laterality: Left;       Home Medications    Prior to Admission medications   Medication Sig Start Date End Date Taking? Authorizing Provider  ASPIRIN PO Take 1 tablet by mouth as needed  (pain).   Yes [provider]  acyclovir (ZOVIRAX) 800 MG tablet Take 1 tablet (800 mg total) by mouth 5 (five) times daily. Patient not taking: Reported on 08/25/2016 06/23/13   Rodolph Bong, MD  amoxicillin-clavulanate (AUGMENTIN) 875-125 MG tablet Take 1 tablet by mouth every 12 (twelve) hours. Patient not taking: Reported on 08/25/2016 02/19/16   Antony Madura, PA-C  traMADol (ULTRAM) 50 MG tablet Take 1 tablet (50 mg total) by mouth every 6 (six) hours as needed. Patient not taking: Reported on 08/25/2016 06/23/13   Rodolph Bong, MD    Family History No family history on file.  Social History Social History  Substance Use Topics  . Smoking status: Never Smoker  . Smokeless tobacco: Never Used  . Alcohol use No     Allergies   Patient has no known allergies.   Review of Systems Review of Systems  Constitutional: Positive for fatigue. Negative for diaphoresis and fever.  HENT: Negative for rhinorrhea and sore throat.   Eyes: Negative for redness.  Respiratory: Negative for cough and shortness of breath.   Cardiovascular: Positive for chest pain and palpitations. Negative for leg swelling.  Gastrointestinal: Negative for abdominal pain, diarrhea, nausea and vomiting.  Genitourinary: Negative for dysuria.  Musculoskeletal: Negative for back pain, myalgias and neck pain.  Skin: Negative for rash.  Neurological: Negative for syncope, light-headedness and headaches.  Psychiatric/Behavioral: The patient is nervous/anxious.      Physical Exam Updated Vital Signs BP (!) 144/94 (BP Location: Right Arm)   Pulse 60   Temp 98.3 F (36.8 C) (  Oral)   Resp 20   Ht 6\' 1"  (1.854 m)   Wt 104.3 kg (230 lb)   SpO2 100%   BMI 30.34 kg/m   Physical Exam  Constitutional: He appears well-developed and well-nourished.  HENT:  Head: Normocephalic and atraumatic.  Mouth/Throat: Oropharynx is clear and moist and mucous membranes are normal. Mucous membranes are not dry.  Eyes:  Conjunctivae are normal.  Neck: Trachea normal and normal range of motion. Neck supple. Normal carotid pulses and no JVD present. No muscular tenderness present. Carotid bruit is not present. No tracheal deviation present.  Cardiovascular: Normal rate, regular rhythm, S1 normal, S2 normal, normal heart sounds and intact distal pulses.  Exam reveals no distant heart sounds and no decreased pulses.   No murmur heard. Pulmonary/Chest: Effort normal and breath sounds normal. No respiratory distress. He has no wheezes. He exhibits no tenderness.  Abdominal: Soft. Normal aorta and bowel sounds are normal. There is no tenderness. There is no rebound and no guarding.  Musculoskeletal: He exhibits no edema.  Neurological: He is alert.  Skin: Skin is warm and dry. He is not diaphoretic. No cyanosis. No pallor.  Psychiatric: He has a normal mood and affect.  Nursing note and vitals reviewed.    ED Treatments / Results  Labs (all labs ordered are listed, but only abnormal results are displayed) Labs Reviewed  CBC - Abnormal; Notable for the following:       Result Value   RBC 5.96 (*)    MCV 74.2 (*)    MCH 25.3 (*)    All other components within normal limits  HEPATIC FUNCTION PANEL - Abnormal; Notable for the following:    Bilirubin, Direct <0.1 (*)    All other components within normal limits  BASIC METABOLIC PANEL  I-STAT TROPONIN, ED    EKG  EKG Interpretation  Date/Time:  Saturday August 25 2016 09:06:17 EDT Ventricular Rate:  61 PR Interval:    QRS Duration: 107 QT Interval:  391 QTC Calculation: 394 R Axis:   -81 Text Interpretation:  Sinus rhythm Left anterior fascicular block Abnormal T, consider ischemia, diffuse leads No significant change since last tracing Confirmed by Richardean Canal ([id number]) on 08/25/2016 10:12:02 AM       Radiology Dg Chest Port 1 View  Result Date: 08/25/2016 CLINICAL DATA:  Chest pain EXAM: PORTABLE CHEST 1 VIEW COMPARISON:  None. FINDINGS: Cardiac  shadow is enlarged but accentuated by the portable technique. The lungs are clear bilaterally. No acute bony abnormality is seen. IMPRESSION: No active disease. Electronically Signed   By: Alcide Clever M.D.   On: 08/25/2016 09:45    Procedures Procedures (including critical care time)  Medications Ordered in ED Medications - No data to display   Initial Impression / Assessment and Plan / ED Course  I have reviewed the triage vital signs and the nursing notes.  Pertinent labs & imaging results that were available during my care of the patient were reviewed by me and considered in my medical decision making (see chart for details).     Patient seen and examined. Work-up initiated. Medications ordered.   Vital signs reviewed and are as follows: BP (!) 144/94 (BP Location: Right Arm)   Pulse 60   Temp 98.3 F (36.8 C) (Oral)   Resp 20   Ht 6\' 1"  (1.854 m)   Wt 104.3 kg (230 lb)   SpO2 100%   BMI 30.34 kg/m   2:10 PM repeat troponin negative. Patient  is stable, feels well. Will discharge to home.  Patient was counseled to return with severe chest pain, especially if the pain is crushing or pressure-like and spreads to the arms, back, neck, or jaw, or if they have sweating, nausea, or shortness of breath with the pain. They were encouraged to call 911 with these symptoms.   The patient verbalized understanding and agreed.    Final Clinical Impressions(s) / ED Diagnoses   Final diagnoses:  Precordial pain  Palpitations   Patient with chest pain and palpitations. No significant risk factors known. Workup in the ED is negative with troponin 2, nonischemic EKG. Perc negative. Possible anxiety component -- patient had panic attack after arrival. No indications for admission at this time. Encouraged PCP follow-up for recheck, routine evaluation.  New Prescriptions New Prescriptions   No medications on file     Renne Crigler, Cordelia Poche 08/25/16 1412    Charlynne Pander,  MD 08/25/16 1540

## 2016-08-25 NOTE — Discharge Instructions (Signed)
Please read and follow all provided instructions.  Your diagnoses today include:  1. Precordial pain   2. Chest pain   3. Palpitations     Tests performed today include:  An EKG of your heart  A chest x-ray  Cardiac enzymes - a blood test for heart muscle damage  Blood counts and electrolytes  Vital signs. See below for your results today.   Medications prescribed:   None  Take any prescribed medications only as directed.  Follow-up instructions: Please follow-up with your primary care provider as soon as you can for further evaluation of your symptoms.   Return instructions:  SEEK IMMEDIATE MEDICAL ATTENTION IF:  You have severe chest pain, especially if the pain is crushing or pressure-like and spreads to the arms, back, neck, or jaw, or if you have sweating, nausea (feeling sick to your stomach), or shortness of breath. THIS IS AN EMERGENCY. Don't wait to see if the pain will go away. Get medical help at once. Call 911 or 0 (operator). DO NOT drive yourself to the hospital.   Your chest pain gets worse and does not go away with rest.   You have an attack of chest pain lasting longer than usual, despite rest and treatment with the medications your caregiver has prescribed.   You wake from sleep with chest pain or shortness of breath.  You feel dizzy or faint.  You have chest pain not typical of your usual pain for which you originally saw your caregiver.   You have any other emergent concerns regarding your health.  Additional Information: Chest pain comes from many different causes. Your caregiver has diagnosed you as having chest pain that is not specific for one problem, but does not require admission.  You are at low risk for an acute heart condition or other serious illness.   Your vital signs today were: BP (!) 141/90 (BP Location: Right Arm)    Pulse 68    Temp 98.3 F (36.8 C) (Oral)    Resp 20    Ht 6\' 1"  (1.854 m)    Wt 104.3 kg (230 lb)    SpO2 100%     BMI 30.34 kg/m  If your blood pressure (BP) was elevated above 135/85 this visit, please have this repeated by your doctor within one month. --------------

## 2018-02-24 DIAGNOSIS — R5383 Other fatigue: Secondary | ICD-10-CM | POA: Diagnosis not present

## 2018-02-24 DIAGNOSIS — R030 Elevated blood-pressure reading, without diagnosis of hypertension: Secondary | ICD-10-CM | POA: Diagnosis not present

## 2018-02-24 DIAGNOSIS — G4733 Obstructive sleep apnea (adult) (pediatric): Secondary | ICD-10-CM | POA: Diagnosis not present

## 2018-03-03 ENCOUNTER — Encounter (HOSPITAL_COMMUNITY): Payer: Self-pay

## 2018-03-03 ENCOUNTER — Emergency Department (HOSPITAL_COMMUNITY)
Admission: EM | Admit: 2018-03-03 | Discharge: 2018-03-03 | Disposition: A | Discharge status: Home / Self Care | Payer: Self-pay | Attending: Emergency Medicine | Admitting: Emergency Medicine

## 2018-03-03 DIAGNOSIS — R69 Illness, unspecified: Secondary | ICD-10-CM

## 2018-03-03 DIAGNOSIS — J111 Influenza due to unidentified influenza virus with other respiratory manifestations: Secondary | ICD-10-CM | POA: Insufficient documentation

## 2018-03-03 DIAGNOSIS — R509 Fever, unspecified: Secondary | ICD-10-CM | POA: Diagnosis not present

## 2018-03-03 MED ORDER — ACETAMINOPHEN 325 MG PO TABS
650.0000 mg | ORAL_TABLET | Freq: Once | ORAL | Status: AC | PRN
  Administered 2018-03-03: 650 mg via ORAL
  Filled 2018-03-03: qty 2

## 2018-03-03 MED ORDER — BENZONATATE 100 MG PO CAPS: 100.0000 mg | ORAL_CAPSULE | Freq: Three times a day (TID) | ORAL | 0 refills | Status: DC

## 2018-03-03 NOTE — Discharge Instructions (Addendum)
Ibuprofen and/or tylenol for fever and body aches. You may alternate as needed. Please refer to attached instructions.

## 2018-03-03 NOTE — ED Provider Notes (Signed)
[facility]-EMERGENCY DEPT Provider Note   CSN: [id number] Arrival date & time: 03/03/18  1925    History   Chief Complaint Chief Complaint  Patient presents with  . Flu like symptoms    HPI Dakota Weber is a 33 y.o. male.     Patient developed body aches and fever early this morning. Highest fever at home reported at 104. Fever responded to antipyretics. Patient seen at urgent care, negative strep, negative flu, normal chest xray. Mildly elevated white blood count. No recent travel. Works at a call center, no known sick contacts.  The history is provided by the patient and medical records. No language interpreter was used.  Influenza  Presenting symptoms: fatigue, fever, headache, myalgias and sore throat   Severity:  Moderate Onset quality:  Sudden Duration:  1 day Progression:  Waxing and waning Chronicity:  New Relieved by:  OTC medications Associated symptoms: chills   Risk factors: no immunocompromised state and no sick contacts     History reviewed. No pertinent past medical history.  There are no active problems to display for this patient.   Past Surgical History:  Procedure Laterality Date  . 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 I&D EXTREMITY Left 03/20/2012   Procedure: IRRIGATION LEFT LONG FINGER;  Surgeon: Tami Ribas, MD;  Location: [facility] OR;  Service: Orthopedics;  Laterality: Left;        Home Medications    Prior to Admission medications   Medication Sig Start Date End Date Taking? Authorizing Provider  ASPIRIN PO Take 1 tablet by mouth as needed (pain).    [provider]    Family History No family history on file.  Social History Social History   Tobacco Use  . Smoking status: Never Smoker  . Smokeless tobacco: Never Used  Substance Use Topics  .  Alcohol use: No  . Drug use: No     Allergies   Patient has no known allergies.   Review of Systems Review of Systems  Constitutional: Positive for chills, fatigue and fever.  HENT: Positive for sore throat.   Musculoskeletal: Positive for myalgias.  Neurological: Positive for headaches.  All other systems reviewed and are negative.    Physical Exam Updated Vital Signs BP 104/80   Pulse (!) 106   Temp (!) 101.2 F (38.4 C) (Oral)   Resp 15   Ht 6\' 1"  (1.854 m)   Wt 108.9 kg   SpO2 94%   BMI 31.66 kg/m   Physical Exam Vitals signs and nursing note reviewed.  Constitutional:      Appearance: Normal appearance. He is not toxic-appearing.  HENT:     Right Ear: Tympanic membrane normal.     Left Ear: Tympanic membrane normal.     Nose: No rhinorrhea.     Mouth/Throat:     Mouth: Mucous membranes are moist.     Pharynx: Oropharynx is clear.  Eyes:     Conjunctiva/sclera: Conjunctivae normal.  Pulmonary:     Effort: Pulmonary effort is normal.     Breath sounds: Normal breath sounds.  Abdominal:     General: Abdomen is flat. Bowel sounds are normal.  Musculoskeletal: Normal range of motion.  Lymphadenopathy:     Cervical: No cervical adenopathy.  Skin:    General: Skin is warm and dry.     Findings:  No rash.  Neurological:     Mental Status: He is alert and oriented to person, place, and time.  Psychiatric:        Mood and Affect: Mood normal.      ED Treatments / Results  Labs (all labs ordered are listed, but only abnormal results are displayed) Labs Reviewed - No data to display  EKG None  Radiology No results found.  Procedures Procedures (including critical care time)  Medications Ordered in ED Medications  acetaminophen (TYLENOL) tablet 650 mg (650 mg Oral Given 03/03/18 2133)     Initial Impression / Assessment and Plan / ED Course  I have reviewed the triage vital signs and the nursing notes.  Pertinent labs & imaging results that  were available during my care of the patient were reviewed by me and considered in my medical decision making (see chart for details).        Patient with symptoms consistent with influenza-like illness.  Vitals are stable, intermittent fever .  No signs of dehydration, tolerating PO's.  Lungs are clear. Chest xray obtained at urgent care normal.  Patient will be discharged with instructions to orally hydrate, rest, and use over-the-counter medications such as anti-inflammatories ibuprofen and Aleve for muscle aches and Tylenol for fever.  Patient will also be given a cough suppressant.   Final Clinical Impressions(s) / ED Diagnoses   Final diagnoses:  Influenza-like illness    ED Discharge Orders         Ordered    benzonatate (TESSALON) 100 MG capsule  Every 8 hours     03/03/18 2237           Felicie Morn, NP 03/03/18 2241    Lorre Nick, MD 03/05/18 [phone]

## 2018-03-03 NOTE — ED Triage Notes (Signed)
Pt arrived with complaints of fever, body aches, shortness of breath that started this morning. Seen at [facility] negative flu swab, strep swab, and chest x-ray. Denies travel or being around anyone sick. Last dose of tylenol 1000mg  given at 1710 today.

## 2018-03-07 DIAGNOSIS — H60392 Other infective otitis externa, left ear: Secondary | ICD-10-CM | POA: Diagnosis not present

## 2018-03-07 DIAGNOSIS — B349 Viral infection, unspecified: Secondary | ICD-10-CM | POA: Diagnosis not present

## 2018-04-28 DIAGNOSIS — F5101 Primary insomnia: Secondary | ICD-10-CM | POA: Diagnosis not present

## 2018-08-08 DIAGNOSIS — R599 Enlarged lymph nodes, unspecified: Secondary | ICD-10-CM | POA: Diagnosis not present

## 2018-08-13 DIAGNOSIS — M26609 Unspecified temporomandibular joint disorder, unspecified side: Secondary | ICD-10-CM | POA: Diagnosis not present

## 2018-08-13 DIAGNOSIS — H6980 Other specified disorders of Eustachian tube, unspecified ear: Secondary | ICD-10-CM | POA: Diagnosis not present

## 2018-09-12 DIAGNOSIS — R51 Headache: Secondary | ICD-10-CM | POA: Diagnosis not present

## 2018-09-18 NOTE — Progress Notes (Signed)
NEUROLOGY CONSULTATION NOTE  Dakota Weber MRN: [id number] DOB: 03-Jan-1986  Referring provider: London Pepper, MD Primary care provider: Lujean Amel, MD  Reason for consult:  headaches  HISTORY OF PRESENT ILLNESS: Dakota Weber is a 33 year old male who presents for headaches.  History supplemented by ED and referring provider note.  Onset:  On March 03, 2018, he developed fever, sore throat, fatigue and body aches.  He reported his temperature was 104.  He went to Urgent care where testing negative for strep and flu.  CXR normal.  He reportedly had mildly elevated white blood count.  He later went to the [facility] ED where he was discharged with ibuprofen and Aleve for myalgias, Tylenol for fever and cough suppressant.  Since July, he wakes up with headaches.  He works Therapist, art for [employer] and started having increased emotional stress related to work that has progressively gotten worse with COVID. Location:  Mostly bifrontal, retro-orbital.  Quality:  Pressure when mild, pounding when severe.  Usually becomes more severe later in the week. Intensity:  4/10 mild, 6/10 severe.  Not a thunderclap headache.  He wakes up with headache in morning but headache itself does not wake him up. Aura:  no Premonitory Phase:  no Postdrome:  no Associated symptoms:  He denies associated nausea, vomiting, photophobia, phonophobia, visual disturbance, neck pain or numbness. Duration:  Wakes up in the morning with headache.  It lasts 10-15 minutes if mild, if more severe, it lasts 3 to 4 hours and then lingers until 1-2 AM Frequency:  Daily (5 days out of week are "severe".  Not as bad during weekend). Frequency of abortive medication: Tylenol or Advil once a week Triggers:  Emotional stress from work and lack of sleep Exacerbating factors:  Emotional stress Relieving factors:  rest Activity:  In morning, pain makes it difficult to move or open his eyes.  He was evaluated at [facility] on 08/13/18 for 2 week duration of right ear pain.  Noted swollen gland, possible oral infection.  He was prescribed amoxicillin.  He was also diagnosed with TMJ dysfunction.    No immediate preceding head trauma but had a concussion in 2008 (football, no LOC).  No prior history of headaches.  Current NSAIDS:  Advil Current analgesics:  Tylenol Current triptans: None Current ergotamine: None Current anti-emetic: None Current muscle relaxants: None Current anti-anxiolytic: None Current sleep aide: None Current Antihypertensive medications: None Current Antidepressant medications: None Current Anticonvulsant medications: None Current anti-CGRP: None Current Vitamins/Herbal/Supplements: None Current Antihistamines/Decongestants: None Other therapy: None   Past NSAIDS:  None Past analgesics:  None Past abortive triptans: None Past abortive ergotamine: None Past muscle relaxants: None Past anti-emetic: None Past antihypertensive medications: None Past antidepressant medications: None Past anticonvulsant medications: None Past anti-CGRP: None Past vitamins/Herbal/Supplements: None Past antihistamines/decongestants: None Other past therapies: None  Caffeine:  Tea, not daily.  Rarely coffee. Alcohol:  occasional Smoker:  no Diet:  hydrates Exercise:  walk Depression:  no; Anxiety:  anxiety Other pain:  no Sleep hygiene:  Poor.  Only sleeps 5 hours.  Wakes up in middle of night and difficult to fall back asleep. Family history of headache:  no  PAST MEDICAL HISTORY: History reviewed. No pertinent past medical history.  PAST SURGICAL HISTORY: Past Surgical History:  Procedure Laterality Date  . AMPUTATION Left 03/20/2012   Procedure: IRRIGATION AND DEBRIDEMENT REVISION AMPUTATION LEFT FINGER;  Surgeon: Tennis Must, MD;  Location: [place];  Service: Orthopedics;  Laterality: Left;  . I&D EXTREMITY Left 03/20/2012   Procedure: IRRIGATION LEFT LONG FINGER;  Surgeon:  Tami Ribas, MD;  Location: [facility] OR;  Service: Orthopedics;  Laterality: Left;    MEDICATIONS: Current Outpatient Medications on File Prior to Visit  Medication Sig Dispense Refill  . acetaminophen (TYLENOL) 500 MG tablet Take 1,000 mg by mouth daily as needed for mild pain or fever.    . benzonatate (TESSALON) 100 MG capsule Take 1 capsule (100 mg total) by mouth every 8 (eight) hours. 21 capsule 0   No current facility-administered medications on file prior to visit.     ALLERGIES: No Known Allergies  FAMILY HISTORY: Family History  Problem Relation Age of Onset  . Cancer Father     SOCIAL HISTORY: Social History   Socioeconomic History  . Marital status: Single    Spouse name: Not on file  . Number of children: Not on file  . Years of education: Not on file  . Highest education level: Not on file  Occupational History  . Not on file  Social Needs  . Financial resource strain: Not on file  . Food insecurity    Worry: Not on file    Inability: Not on file  . Transportation needs    Medical: Not on file    Non-medical: Not on file  Tobacco Use  . Smoking status: Never Smoker  . Smokeless tobacco: Never Used  Substance and Sexual Activity  . Alcohol use: No  . Drug use: No  . Sexual activity: Yes  Lifestyle  . Physical activity    Days per week: Not on file    Minutes per session: Not on file  . Stress: Not on file  Relationships  . Social Musician on phone: Not on file    Gets together: Not on file    Attends religious service: Not on file    Active member of club or organization: Not on file    Attends meetings of clubs or organizations: Not on file    Relationship status: Not on file  . Intimate partner violence    Fear of current or ex partner: Not on file    Emotionally abused: Not on file    Physically abused: Not on file    Forced sexual activity: Not on file  Other Topics Concern  . Not on file  Social History Narrative  . Not on  file    REVIEW OF SYSTEMS: Constitutional: No fevers, chills, or sweats, no generalized fatigue, change in appetite Eyes: No visual changes, double vision, eye pain Ear, nose and throat: No hearing loss, ear pain, nasal congestion, sore throat Cardiovascular: No chest pain, palpitations Respiratory:  No shortness of breath at rest or with exertion, wheezes GastrointestinaI: No nausea, vomiting, diarrhea, abdominal pain, fecal incontinence Genitourinary:  No dysuria, urinary retention or frequency Musculoskeletal:  No neck pain, back pain Integumentary: No rash, pruritus, skin lesions Neurological: as above Psychiatric: No depression, insomnia, anxiety Endocrine: No palpitations, fatigue, diaphoresis, mood swings, change in appetite, change in weight, increased thirst Hematologic/Lymphatic:  No purpura, petechiae. Allergic/Immunologic: no itchy/runny eyes, nasal congestion, recent allergic reactions, rashes  PHYSICAL EXAM: Blood pressure (!) 151/94, pulse 79, temperature 97.9 F (36.6 C), height 6\' 1"  (1.854 m), weight 249 lb 12.8 oz (113.3 kg), SpO2 97 %. General: No acute distress.  Patient appears well-groomed.   Head:  Normocephalic/atraumatic Eyes:  fundi examined but not visualized Neck: supple, no paraspinal tenderness, full  range of motion Back: No paraspinal tenderness Heart: regular rate and rhythm Lungs: Clear to auscultation bilaterally. Vascular: No carotid bruits. Neurological Exam: Mental status: alert and oriented to person, place, and time, recent and remote memory intact, fund of knowledge intact, attention and concentration intact, speech fluent and not dysarthric, language intact. Cranial nerves: CN I: not tested CN II: pupils equal, round and reactive to light, visual fields intact CN III, IV, VI:  full range of motion, no nystagmus, no ptosis CN V: facial sensation intact CN VII: upper and lower face symmetric CN VIII: hearing intact CN IX, X: gag intact,  uvula midline CN XI: sternocleidomastoid and trapezius muscles intact CN XII: tongue midline Bulk & Tone: normal, no fasciculations. Motor:  5/5 throughout  Sensation: temperature and vibration sensation intact. Deep Tendon Reflexes:  2+ throughout, toes downgoing.   Finger to nose testing:  Without dysmetria.   Heel to shin:  Without dysmetria.   Gait:  Normal station and stride.  Able to turn and tandem walk. Romberg negative.  IMPRESSION: Chronic daily headaches, morning headaches.  Work-related stress probably factor.  Given poor sleep hygiene and morning headaches, would consider OSA as well.  No red flags on exam and history thus far.  PLAN: 1.  Will order sleep study to assess for OSA 2.  Limit use of pain relievers to no more than 2 days out of week to prevent risk of rebound or medication-overuse headache. 3.  Advised that he see an optometrist for formal eye exam. 4.  Further recommendations pending results.  If negative for OSA, then would likely start nortriptyline.  Will consider MRI of brain as well. 5.  Follow up in 4 months.  Thank you for allowing me to take part in the care of this patient.  Shon MilletAdam Dhyan Noah, DO  CC:  Dibas Docia ChuckKoirala, MD  Farris HasAaron Morrow, MD

## 2018-09-19 ENCOUNTER — Other Ambulatory Visit: Payer: Self-pay

## 2018-09-19 ENCOUNTER — Encounter: Payer: Self-pay | Admitting: Neurology

## 2018-09-19 ENCOUNTER — Ambulatory Visit (INDEPENDENT_AMBULATORY_CARE_PROVIDER_SITE_OTHER): Payer: BC Managed Care – PPO | Admitting: Neurology

## 2018-09-19 VITALS — BP 151/94 | HR 79 | Temp 97.9°F | Ht 73.0 in | Wt 249.8 lb

## 2018-09-19 DIAGNOSIS — R51 Headache: Secondary | ICD-10-CM | POA: Diagnosis not present

## 2018-09-19 DIAGNOSIS — Z72821 Inadequate sleep hygiene: Secondary | ICD-10-CM | POA: Diagnosis not present

## 2018-09-19 DIAGNOSIS — F419 Anxiety disorder, unspecified: Secondary | ICD-10-CM | POA: Diagnosis not present

## 2018-09-19 DIAGNOSIS — R519 Headache, unspecified: Secondary | ICD-10-CM

## 2018-09-19 NOTE — Patient Instructions (Signed)
1.  I want to first check for any evidence of sleep apnea.  I will order you a sleep study.   2.  Also, I want you go see an optometrist for a formal eye exam.  Have note sent to me. 3.  Limit use of pain relievers to no more than 2 days out of week to prevent risk of rebound or medication-overuse headache. 4.  Further recommendations pending results. 5.  Follow up in 4 months

## 2018-09-29 ENCOUNTER — Other Ambulatory Visit: Payer: Self-pay

## 2018-09-29 DIAGNOSIS — R0602 Shortness of breath: Secondary | ICD-10-CM | POA: Diagnosis not present

## 2018-09-29 DIAGNOSIS — R6889 Other general symptoms and signs: Secondary | ICD-10-CM | POA: Diagnosis not present

## 2018-09-29 DIAGNOSIS — Z20822 Contact with and (suspected) exposure to covid-19: Secondary | ICD-10-CM

## 2018-09-30 ENCOUNTER — Telehealth: Payer: Self-pay | Admitting: Family Medicine

## 2018-09-30 LAB — NOVEL CORONAVIRUS, NAA: SARS-CoV-2, NAA: NOT DETECTED

## 2018-09-30 NOTE — Telephone Encounter (Signed)
Pt called to get COVID test results.  Informed him they are negative.

## 2018-11-19 DIAGNOSIS — G4733 Obstructive sleep apnea (adult) (pediatric): Secondary | ICD-10-CM | POA: Diagnosis not present

## 2019-01-12 ENCOUNTER — Encounter: Payer: Self-pay | Admitting: Neurology

## 2019-01-30 ENCOUNTER — Ambulatory Visit: Payer: BC Managed Care – PPO | Admitting: Neurology

## 2019-03-04 DIAGNOSIS — F411 Generalized anxiety disorder: Secondary | ICD-10-CM | POA: Diagnosis not present

## 2019-03-22 DIAGNOSIS — Z20828 Contact with and (suspected) exposure to other viral communicable diseases: Secondary | ICD-10-CM | POA: Diagnosis not present

## 2019-03-22 DIAGNOSIS — Z03818 Encounter for observation for suspected exposure to other biological agents ruled out: Secondary | ICD-10-CM | POA: Diagnosis not present

## 2019-03-26 DIAGNOSIS — H60502 Unspecified acute noninfective otitis externa, left ear: Secondary | ICD-10-CM | POA: Diagnosis not present

## 2019-03-26 DIAGNOSIS — Z20822 Contact with and (suspected) exposure to covid-19: Secondary | ICD-10-CM | POA: Diagnosis not present

## 2019-03-27 DIAGNOSIS — Z20828 Contact with and (suspected) exposure to other viral communicable diseases: Secondary | ICD-10-CM | POA: Diagnosis not present

## 2019-03-30 DIAGNOSIS — U071 COVID-19: Secondary | ICD-10-CM | POA: Diagnosis not present

## 2019-03-30 DIAGNOSIS — F419 Anxiety disorder, unspecified: Secondary | ICD-10-CM | POA: Diagnosis not present

## 2019-05-29 DIAGNOSIS — F430 Acute stress reaction: Secondary | ICD-10-CM | POA: Diagnosis not present

## 2019-06-05 DIAGNOSIS — F430 Acute stress reaction: Secondary | ICD-10-CM | POA: Diagnosis not present

## 2019-07-23 DIAGNOSIS — F419 Anxiety disorder, unspecified: Secondary | ICD-10-CM | POA: Diagnosis not present

## 2019-07-27 ENCOUNTER — Emergency Department (HOSPITAL_COMMUNITY)
Admission: EM | Admit: 2019-07-27 | Discharge: 2019-07-27 | Disposition: A | Discharge status: Home / Self Care | Payer: BC Managed Care – PPO | Attending: Emergency Medicine | Admitting: Emergency Medicine

## 2019-07-27 ENCOUNTER — Encounter (HOSPITAL_COMMUNITY): Payer: Self-pay

## 2019-07-27 ENCOUNTER — Other Ambulatory Visit: Payer: Self-pay

## 2019-07-27 ENCOUNTER — Emergency Department (HOSPITAL_COMMUNITY): Payer: BC Managed Care – PPO

## 2019-07-27 DIAGNOSIS — R079 Chest pain, unspecified: Secondary | ICD-10-CM | POA: Diagnosis not present

## 2019-07-27 DIAGNOSIS — R5383 Other fatigue: Secondary | ICD-10-CM | POA: Diagnosis not present

## 2019-07-27 DIAGNOSIS — M791 Myalgia, unspecified site: Secondary | ICD-10-CM | POA: Diagnosis not present

## 2019-07-27 DIAGNOSIS — R52 Pain, unspecified: Secondary | ICD-10-CM

## 2019-07-27 MED ORDER — IBUPROFEN 200 MG PO TABS
600.0000 mg | ORAL_TABLET | Freq: Once | ORAL | Status: AC
  Administered 2019-07-27: 600 mg via ORAL
  Filled 2019-07-27: qty 3

## 2019-07-27 NOTE — ED Triage Notes (Signed)
Patient c/o "pain all over." that started yesterday. Patient states pain has been progressively worse. Patient denies any fever, N/v.

## 2019-07-27 NOTE — ED Provider Notes (Signed)
[facility]-EMERGENCY DEPT Provider Note   CSN: [id number] Arrival date & time: 07/27/19  1226     History Chief Complaint  Patient presents with  . Generalized Body Aches    Hasson Gaspard is a 34 y.o. male.  34 year old male with prior medical history as detailed below presents for evaluation of achy pain.  Patient reports that he received his first Covid vaccination last week.  For the last 3 to 4 days he complains of feeling achy pain all over.  He also reports mild fatigue.  He denies fever.  He denies cough or congestion.  The history is provided by the patient.  Illness Location:  Achy pain, fatigue  Severity:  Mild Onset quality:  Gradual Duration:  4 days Timing:  Constant Progression:  Worsening Chronicity:  New Associated symptoms: fatigue   Associated symptoms: no fever        History reviewed. No pertinent past medical history.  There are no problems to display for this patient.   Past Surgical History:  Procedure Laterality Date  . 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 I & D EXTREMITY Left 03/20/2012   Procedure: IRRIGATION LEFT LONG FINGER;  Surgeon: Tami Ribas, MD;  Location: [facility];  Service: Orthopedics;  Laterality: Left;       Family History  Problem Relation Age of Onset  . Cancer Father     Social History   Tobacco Use  . Smoking status: Never Smoker  . Smokeless tobacco: Never Used  Vaping Use  . Vaping Use: Never used  Substance Use Topics  . Alcohol use: No  . Drug use: No    Home Medications Prior to Admission medications   Medication Sig Start Date End Date Taking? Authorizing Provider  acetaminophen (TYLENOL) 500 MG tablet Take 1,000 mg by mouth daily as needed for mild pain or fever.    [provider]    Allergies    Patient has no known  allergies.  Review of Systems   Review of Systems  Constitutional: Positive for fatigue. Negative for fever.  All other systems reviewed and are negative.   Physical Exam Updated Vital Signs BP (!) 152/110 (BP Location: Left Arm)   Pulse 71   Temp 98.1 F (36.7 C) (Oral)   Resp 18   Ht 6\' 1"  (1.854 m)   Wt 112.5 kg   SpO2 98%   BMI 32.72 kg/m   Physical Exam Vitals and nursing note reviewed.  Constitutional:      General: He is not in acute distress.    Appearance: Normal appearance. He is well-developed.  HENT:     Head: Normocephalic and atraumatic.  Eyes:     Conjunctiva/sclera: Conjunctivae normal.     Pupils: Pupils are equal, round, and reactive to light.  Cardiovascular:     Rate and Rhythm: Normal rate and regular rhythm.     Heart sounds: Normal heart sounds.  Pulmonary:     Effort: Pulmonary effort is normal. No respiratory distress.     Breath sounds: Normal breath sounds.  Abdominal:     General: There is no distension.     Palpations: Abdomen is soft.     Tenderness: There is no abdominal tenderness.  Musculoskeletal:        General: No deformity. Normal range of motion.  Cervical back: Normal range of motion and neck supple.  Skin:    General: Skin is warm and dry.  Neurological:     General: No focal deficit present.     Mental Status: He is alert and oriented to person, place, and time. Mental status is at baseline.     ED Results / Procedures / Treatments   Labs (all labs ordered are listed, but only abnormal results are displayed) Labs Reviewed - No data to display  EKG EKG Interpretation  Date/Time:  Monday July 27 2019 13:51:42 EDT Ventricular Rate:  77 PR Interval:    QRS Duration: 99 QT Interval:  382 QTC Calculation: 433 R Axis:   -111 Text Interpretation: Sinus rhythm Probable left atrial enlargement Left anterior fascicular block Abnormal T, consider ischemia, diffuse leads 12 Lead; Mason-Likar Otherwise no significant  change Confirmed by Kristine Royal [phone]) on 07/27/2019 2:23:21 PM   Radiology No results found.  Procedures Procedures (including critical care time)  Medications Ordered in ED Medications  ibuprofen (ADVIL) tablet 600 mg (has no administration in time range)    ED Course  I have reviewed the triage vital signs and the nursing notes.  Pertinent labs & imaging results that were available during my care of the patient were reviewed by me and considered in my medical decision making (see chart for details).    MDM Rules/Calculators/A&P                          MDM  Screen complete  Shaquill Iseman was evaluated in Emergency Department on 07/27/2019 for the symptoms described in the history of present illness. He was evaluated in the context of the global COVID-19 pandemic, which necessitated consideration that the patient might be at risk for infection with the SARS-CoV-2 virus that causes COVID-19. Institutional protocols and algorithms that pertain to the evaluation of patients at risk for COVID-19 are in a state of rapid change based on information released by regulatory bodies including the CDC and federal and state organizations. These policies and algorithms were followed during the patient's care in the ED.   Patient is presenting for evaluation of myalgias and fatigue.  Symptoms are most likely secondary to recent vaccination for Covid administered last week.  Screening EKG and chest x-ray are without evidence of acute abnormality.  Patient does feel improved following ministration of ibuprofen in the ED.  Patient does understand need for close follow-up.  Strict return precautions given and understood.  Final Clinical Impression(s) / ED Diagnoses Final diagnoses:  Body aches  Myalgia    Rx / DC Orders ED Discharge Orders    None       Wynetta Fines, MD 07/27/19 1459

## 2019-07-27 NOTE — Discharge Instructions (Signed)
Please return for any problem.  Follow-up with your regular care provider as instructed. °

## 2019-08-06 ENCOUNTER — Emergency Department (HOSPITAL_COMMUNITY)
Admission: EM | Admit: 2019-08-06 | Discharge: 2019-08-07 | Disposition: A | Discharge status: Home / Self Care | Payer: BC Managed Care – PPO | Attending: Emergency Medicine | Admitting: Emergency Medicine

## 2019-08-06 ENCOUNTER — Encounter (HOSPITAL_COMMUNITY): Payer: Self-pay | Admitting: Emergency Medicine

## 2019-08-06 DIAGNOSIS — F419 Anxiety disorder, unspecified: Secondary | ICD-10-CM | POA: Diagnosis not present

## 2019-08-06 DIAGNOSIS — Z20822 Contact with and (suspected) exposure to covid-19: Secondary | ICD-10-CM | POA: Diagnosis not present

## 2019-08-06 DIAGNOSIS — F322 Major depressive disorder, single episode, severe without psychotic features: Secondary | ICD-10-CM | POA: Diagnosis not present

## 2019-08-06 LAB — COMPREHENSIVE METABOLIC PANEL
ALT: 20 U/L (ref 0–44)
AST: 22 U/L (ref 15–41)
Albumin: 4.7 g/dL (ref 3.5–5.0)
Alkaline Phosphatase: 63 U/L (ref 38–126)
Anion gap: 11 (ref 5–15)
BUN: 13 mg/dL (ref 6–20)
CO2: 28 mmol/L (ref 22–32)
Calcium: 9.9 mg/dL (ref 8.9–10.3)
Chloride: 102 mmol/L (ref 98–111)
Creatinine, Ser: 1.16 mg/dL (ref 0.61–1.24)
GFR calc Af Amer: >60 mL/min (ref >60)
GFR calc non Af Amer: >60 mL/min (ref >60)
Glucose, Bld: 115 mg/dL — ABNORMAL HIGH (ref 70–99)
Potassium: 3.7 mmol/L (ref 3.5–5.1)
Sodium: 141 mmol/L (ref 135–145)
Total Bilirubin: 1 mg/dL (ref 0.3–1.2)
Total Protein: 8.9 g/dL — ABNORMAL HIGH (ref 6.5–8.1)

## 2019-08-06 LAB — CBC WITH DIFFERENTIAL/PLATELET
Abs Immature Granulocytes: 0.07 10*3/uL (ref 0.00–0.07)
Basophils Absolute: 0.1 10*3/uL (ref 0.0–0.1)
Basophils Relative: 1 %
Eosinophils Absolute: 0 10*3/uL (ref 0.0–0.5)
Eosinophils Relative: 0 %
HCT: 48.9 % (ref 39.0–52.0)
Hemoglobin: 16.6 g/dL (ref 13.0–17.0)
Immature Granulocytes: 1 %
Lymphocytes Relative: 14 %
Lymphs Abs: 2 10*3/uL (ref 0.7–4.0)
MCH: 26.1 pg (ref 26.0–34.0)
MCHC: 33.9 g/dL (ref 30.0–36.0)
MCV: 76.9 fL — ABNORMAL LOW (ref 80.0–100.0)
Monocytes Absolute: 1.1 10*3/uL — ABNORMAL HIGH (ref 0.1–1.0)
Monocytes Relative: 7 %
Neutro Abs: 11.2 10*3/uL — ABNORMAL HIGH (ref 1.7–7.7)
Neutrophils Relative %: 77 %
Platelets: 271 10*3/uL (ref 150–400)
RBC: 6.36 MIL/uL — ABNORMAL HIGH (ref 4.22–5.81)
RDW: 13.8 % (ref 11.5–15.5)
WBC: 14.3 10*3/uL — ABNORMAL HIGH (ref 4.0–10.5)
nRBC: 0 % (ref 0.0–0.2)

## 2019-08-06 LAB — SALICYLATE LEVEL: Salicylate Lvl: <7 mg/dL — ABNORMAL LOW (ref 7.0–30.0)

## 2019-08-06 LAB — RAPID URINE DRUG SCREEN, HOSP PERFORMED
Amphetamines: NOT DETECTED
Barbiturates: NOT DETECTED
Benzodiazepines: NOT DETECTED
Cocaine: NOT DETECTED
Opiates: NOT DETECTED
Tetrahydrocannabinol: NOT DETECTED

## 2019-08-06 LAB — ETHANOL: Alcohol, Ethyl (B): <10 mg/dL (ref <10)

## 2019-08-06 LAB — ACETAMINOPHEN LEVEL: Acetaminophen (Tylenol), Serum: <10 ug/mL — ABNORMAL LOW (ref 10–30)

## 2019-08-06 NOTE — ED Provider Notes (Signed)
[facility]-EMERGENCY DEPT Provider Note   CSN: [id number] Arrival date & time: 08/06/19  1453     History Chief Complaint  Patient presents with  . Medical Clearance  . Depression  . Anxiety    Dakota Weber is a 34 y.o. male presents for evaluation of acute onset, persistent and progressively worsening feeling of overwhelming, intense anxiety.  He noted this upon awakening this morning.  He states that he is experiencing a number of different stressors from losing his job to separating from his wife.  This has been progressive over the last 2 months.  He reports that he has not been sleeping well and has not been cooking for himself because the thought of cooking for himself feels very overwhelming.  He called his therapist last night and states "she kind of talked me off the ledge".  When asked about suicidality he states "all I will say about that is that it feels like it would be better if I was not here".  He denies any plan to harm himself.  He denies homicidal ideation or auditory or visual hallucinations.  He appears extremely anxious, intermittently tearful.  He denies any physical complaints.  He is a non-smoker, denies recreational drug use and does not drink alcohol.  He states that he likes to go fishing to help with his anxiety.  The history is provided by the patient.       History reviewed. No pertinent past medical history.  There are no problems to display for this patient.   Past Surgical History:  Procedure Laterality Date  . 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 I & D EXTREMITY Left 03/20/2012   Procedure: IRRIGATION LEFT LONG FINGER;  Surgeon: Tami Ribas, MD;  Location: [facility];  Service: Orthopedics;  Laterality: Left;       Family History  Problem Relation Age of Onset  . Cancer  Father     Social History   Tobacco Use  . Smoking status: Never Smoker  . Smokeless tobacco: Never Used  Vaping Use  . Vaping Use: Never used  Substance Use Topics  . Alcohol use: No  . Drug use: No    Home Medications Prior to Admission medications   Medication Sig Start Date End Date Taking? Authorizing Provider  citalopram (CELEXA) 10 MG tablet Take 10 mg by mouth daily. 07/23/19  Yes [provider]  acetaminophen (TYLENOL) 500 MG tablet Take 1,000 mg by mouth daily as needed for mild pain or fever. Patient not taking: Reported on 08/06/2019    [provider]  sertraline (ZOLOFT) 50 MG tablet Take by mouth. Patient not taking: Reported on 08/06/2019 03/04/19   [provider]    Allergies    Patient has no known allergies.  Review of Systems   Review of Systems  Constitutional: Negative for chills and fever.  Psychiatric/Behavioral: Positive for dysphoric mood and sleep disturbance. The patient is nervous/anxious.   All other systems reviewed and are negative.   Physical Exam Updated Vital Signs BP (!) 153/109   Pulse 104   Temp 99.5 F (37.5 C) (Oral)   Resp 15   SpO2 96%   Physical Exam Vitals and nursing note reviewed.  Constitutional:      General: He is not in acute distress.    Appearance: He is well-developed.  Comments: Resting in chair, appears very anxious  HENT:     Head: Normocephalic and atraumatic.  Eyes:     General:        Right eye: No discharge.        Left eye: No discharge.     Conjunctiva/sclera: Conjunctivae normal.  Neck:     Vascular: No JVD.     Trachea: No tracheal deviation.  Cardiovascular:     Rate and Rhythm: Normal rate.  Pulmonary:     Effort: Pulmonary effort is normal.     Breath sounds: Normal breath sounds.  Abdominal:     General: There is no distension.  Skin:    Findings: No erythema.  Neurological:     Mental Status: He is alert.  Psychiatric:        Mood and Affect: Mood is  anxious. Affect is tearful.        Speech: Speech normal.        Behavior: Behavior is withdrawn. Behavior is cooperative.        Thought Content: Thought content does not include homicidal or suicidal plan.     Comments: Intermittently tearful, makes poor eye contact.     ED Results / Procedures / Treatments   Labs (all labs ordered are listed, but only abnormal results are displayed) Labs Reviewed  COMPREHENSIVE METABOLIC PANEL - Abnormal; Notable for the following components:      Result Value   Glucose, Bld 115 (*)    Total Protein 8.9 (*)    All other components within normal limits  CBC WITH DIFFERENTIAL/PLATELET - Abnormal; Notable for the following components:   WBC 14.3 (*)    RBC 6.36 (*)    MCV 76.9 (*)    Neutro Abs 11.2 (*)    Monocytes Absolute 1.1 (*)    All other components within normal limits  ACETAMINOPHEN LEVEL - Abnormal; Notable for the following components:   Acetaminophen (Tylenol), Serum <10 (*)    All other components within normal limits  SALICYLATE LEVEL - Abnormal; Notable for the following components:   Salicylate Lvl <7.0 (*)    All other components within normal limits  ETHANOL  RAPID URINE DRUG SCREEN, HOSP PERFORMED    EKG None  Radiology No results found.  Procedures Procedures (including critical care time)  Medications Ordered in ED Medications - No data to display  ED Course  I have reviewed the triage vital signs and the nursing notes.  Pertinent labs & imaging results that were available during my care of the patient were reviewed by me and considered in my medical decision making (see chart for details).    MDM Rules/Calculators/A&P                          Patient presenting for evaluation of severe anxiety.  Admits to some passive suicidality but no plan.  He is afebrile, mildly hypertensive on arrival.  Appears quite anxious and intermittently tearful.  Physical examination is reassuring.  Screening labs reviewed and  interpreted by myself shows nonspecific leukocytosis, no anemia or metabolic derangements.  He is medically cleared for TTS evaluation at this time.  Care signed out to default provider.   Final Clinical Impression(s) / ED Diagnoses Final diagnoses:  Anxiety    Rx / DC Orders ED Discharge Orders    None       Bennye Alm 08/06/19 2316    Maia Plan, MD 08/07/19  1352  

## 2019-08-06 NOTE — ED Notes (Signed)
Patient changed into paper scrubs. One labeled patient belongings bag secured at triage nurse's station. 

## 2019-08-06 NOTE — ED Triage Notes (Addendum)
Patient reports waking up today feeling depressed with episode of anxiety and hyperventilating. Hx acute stress disorder. Reports taking medications as prescribed. States follows closely with therapist. Denies SI/HI/A/V/H.

## 2019-08-07 ENCOUNTER — Ambulatory Visit (INDEPENDENT_AMBULATORY_CARE_PROVIDER_SITE_OTHER)
Admission: EM | Admit: 2019-08-07 | Discharge: 2019-08-07 | Disposition: A | Discharge status: Home / Self Care | Payer: BC Managed Care – PPO | Source: Home / Self Care

## 2019-08-07 ENCOUNTER — Encounter (HOSPITAL_COMMUNITY): Payer: Self-pay | Admitting: Emergency Medicine

## 2019-08-07 ENCOUNTER — Other Ambulatory Visit: Payer: Self-pay

## 2019-08-07 DIAGNOSIS — F331 Major depressive disorder, recurrent, moderate: Secondary | ICD-10-CM | POA: Diagnosis not present

## 2019-08-07 DIAGNOSIS — F321 Major depressive disorder, single episode, moderate: Secondary | ICD-10-CM | POA: Diagnosis not present

## 2019-08-07 LAB — SARS CORONAVIRUS 2 BY RT PCR (HOSPITAL ORDER, PERFORMED IN ~~LOC~~ HOSPITAL LAB): SARS Coronavirus 2: NEGATIVE

## 2019-08-07 MED ORDER — ACETAMINOPHEN 325 MG PO TABS
650.0000 mg | ORAL_TABLET | Freq: Four times a day (QID) | ORAL | Status: DC | PRN
  Administered 2019-08-07: 650 mg via ORAL
  Filled 2019-08-07: qty 2

## 2019-08-07 NOTE — ED Notes (Signed)
Pt A&O x 4, [facility] transfer presents with Depression & Anxiety, feeling like he  Was having a nervous breakdown earlier yesterday.  Pt is currently separated from his wife and on FMLA from his job.  Denies SI, HI or AVH.  Skin search completed, monitoring for safety.  Pt remains anxious and cooperative, no distress noted.

## 2019-08-07 NOTE — ED Notes (Signed)
Recheck of pt's BP gave elevated results of 168/121 in left arm and 160/110 in right arm. Aline August NP notified. Will continue to monitor for safety on unit

## 2019-08-07 NOTE — ED Notes (Signed)
Patient continues to sleep no interaction

## 2019-08-07 NOTE — ED Notes (Signed)
Patient declined on breakfast,Patient utilized toiletries. Patient spoke with nurse regarding his stability plan for treatment.

## 2019-08-07 NOTE — ED Notes (Signed)
Pt stable at time of d/c. Reviewed avs with pt, verbalized understanding. Gave pt belongings back, escorted out of facility into care of family.

## 2019-08-07 NOTE — ED Provider Notes (Signed)
FBC/OBS ASAP Discharge Summary  Date and Time: 08/07/2019 2:02 PM  Name: Dakota Weber  MRN:  [id number]   Discharge Diagnoses:  Final diagnoses:  Current moderate episode of major depressive disorder without prior episode [facility])    Subjective: Patient states "I see my counselor every Friday afternoon at 5, it has been the highlight of my week lately."  Stay Summary:  Per TTS assessment: Pt is a 34 year old separated male who presents unaccompanied to [facility] ED reporting symptoms of depression and anxiety, including suicidal ideation. Pt states that he has felt increasingly depressed for the past two months and states, "Today I felt I was having a mental breakdown." When asked to describe his mood, Pt says, "in limbo." He says he experiencing problems with motivation and became overwhelmed by simply trying to cook a meal for himself.   Patient transported to [facility] for observation.  Patient voluntary.  Patient assessed by nurse practitioner.  Patient alert and oriented, answers appropriately. Patient reports recent stressors including financial concerns, marital concerns, recently separated x1 month, and concerns with employer, currently only.  Patient reports he resides alone in [place].  Patient denies access to weapons.  Patient denies alcohol and substance use.  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  Patient reports he has experienced depression for approximately 6 months.  Patient reports prescribed citalopram by primary care provider.  Patient reports compliance with antidepressant medication.  Patient reports he is currently followed by outpatient therapist, sees outpatient therapy every Friday at 5 PM.    Patient reports plan to reside with his brother, Dakota Weber, for some time.  Patient reports brother and mother as well as in-laws and pastor are supportive.  Patient reports  depressed mood.  Patient denies suicidal and homicidal ideations.  Patient denies history of suicide attempts, denies history of self-harm behaviors.  Patient denies auditory visual hallucinations.  No evidence of delusional thought content.  Patient denies symptoms of paranoia.  There is no indication that the patient is responding to internal stimuli.  Patient gives verbal consent to speak with his brother, Dakota Weber.  Spoke with patient's brother Dakota Weber who denies concerns for patient safety.  Patient's brother reports patient will be welcome to stay with him.  Patient's mom reports plan to pick up patient and transport to his home at 3:00 today.  Total Time spent with patient: 30 minutes  Past Psychiatric History: Depression Past Medical History: History reviewed. No pertinent past medical history.  Past Surgical History:  Procedure Laterality Date  . 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 I & D EXTREMITY Left 03/20/2012   Procedure: IRRIGATION LEFT LONG FINGER;  Surgeon: Tami Ribas, MD;  Location: [facility] OR;  Service: Orthopedics;  Laterality: Left;   Family History:  Family History  Problem Relation Age of Onset  . Cancer Father    Family Psychiatric History: None reported Social History:  Social History   Substance and Sexual Activity  Alcohol Use No     Social History   Substance and Sexual Activity  Drug Use No    Social History   Socioeconomic History  . Marital status: Married    Spouse name: Not on file  . Number of children: Not on file  . Years of education: Not  on file  . Highest education level: Not on file  Occupational History  . Not on file  Tobacco Use  . Smoking status: Never Smoker  . Smokeless tobacco: Never Used  Vaping Use  . Vaping Use: Never used  Substance and Sexual Activity  . Alcohol use:  No  . Drug use: No  . Sexual activity: Yes  Other Topics Concern  . Not on file  Social History Narrative   Bachelors degree/ apt with wife/ R handed/ occasional caffeine/likes to fish   Social Determinants of Health   Financial Resource Strain:   . Difficulty of Paying Living Expenses:   Food Insecurity:   . Worried About Programme researcher, broadcasting/film/video in the Last Year:   . Barista in the Last Year:   Transportation Needs:   . Freight forwarder (Medical):   Marland Kitchen Lack of Transportation (Non-Medical):   Physical Activity:   . Days of Exercise per Week:   . Minutes of Exercise per Session:   Stress:   . Feeling of Stress :   Social Connections:   . Frequency of Communication with Friends and Family:   . Frequency of Social Gatherings with Friends and Family:   . Attends Religious Services:   . Active Member of Clubs or Organizations:   . Attends Banker Meetings:   Marland Kitchen Marital Status:    SDOH:  SDOH Screenings   Alcohol Screen:   . Last Alcohol Screening Score (AUDIT):   Depression (PHQ2-9): Medium Risk  . PHQ-2 Score: 10  Financial Resource Strain:   . Difficulty of Paying Living Expenses:   Food Insecurity:   . Worried About Programme researcher, broadcasting/film/video in the Last Year:   . [employer] of Food in the Last Year:   Housing:   . Last Housing Risk Score:   Physical Activity:   . Days of Exercise per Week:   . Minutes of Exercise per Session:   Social Connections:   . Frequency of Communication with Friends and Family:   . Frequency of Social Gatherings with Friends and Family:   . Attends Religious Services:   . Active Member of Clubs or Organizations:   . Attends Banker Meetings:   Marland Kitchen Marital Status:   Stress:   . Feeling of Stress :   Tobacco Use: Low Risk   . Smoking Tobacco Use: Never Smoker  . Smokeless Tobacco Use: Never Used  Transportation Needs:   . Freight forwarder (Medical):   Marland Kitchen Lack of Transportation (Non-Medical):     Has this  patient used any form of tobacco in the last 30 days? (Cigarettes, Smokeless Tobacco, Cigars, and/or Pipes) A prescription for an FDA-approved tobacco cessation medication was offered at discharge and the patient refused  Current Medications:  Current Facility-Administered Medications  Medication Dose Route Frequency Provider Last Rate Last Admin  . acetaminophen (TYLENOL) tablet 650 mg  650 mg Oral Q6H PRN Anike, Adaku C, NP   650 mg at 08/07/19 0505   Current Outpatient Medications  Medication Sig Dispense Refill  . citalopram (CELEXA) 10 MG tablet Take 10 mg by mouth daily.      PTA Medications: (Not in a hospital admission)   Musculoskeletal  Strength & Muscle Tone: within normal limits Gait & Station: normal Patient leans: N/A  Psychiatric Specialty Exam  Presentation  General Appearance: No data recorded Eye Contact:No data recorded Speech:No data recorded Speech Volume:No data recorded Handedness:No data recorded  Mood  and Affect  Mood:No data recorded Affect:No data recorded  Thought Process  Thought Processes:No data recorded Descriptions of Associations:No data recorded Orientation:No data recorded Thought Content:No data recorded Hallucinations:No data recorded Ideas of Reference:No data recorded Suicidal Thoughts:No data recorded Homicidal Thoughts:No data recorded  Sensorium  Memory:No data recorded Judgment:No data recorded Insight:No data recorded  Executive Functions  Concentration:No data recorded Attention Span:No data recorded Recall:No data recorded Fund of Knowledge:No data recorded Language:No data recorded  Psychomotor Activity  Psychomotor Activity:No data recorded  Assets  Assets:No data recorded  Sleep  Sleep:No data recorded  Physical Exam  Physical Exam ROS Blood pressure (!) 132/86, pulse 87, temperature 97.7 F (36.5 C), temperature source Temporal, resp. rate 16, SpO2 99 %. There is no height or weight on file to  calculate BMI.  Demographic Factors:  Male  Loss Factors: NA  Historical Factors: NA  Risk Reduction Factors:   Religious beliefs about death, Living with another person, especially a relative, Positive social support, Positive therapeutic relationship and Positive coping skills or problem solving skills  Continued Clinical Symptoms:    Cognitive Features That Contribute To Risk:  None    Suicide Risk:  Minimal: No identifiable suicidal ideation.  Patients presenting with no risk factors but with morbid ruminations; may be classified as minimal risk based on the severity of the depressive symptoms  Plan Of Care/Follow-up recommendations:  Other:  Patient reviewed with Dr. Nelly Rout.   Follow-up with established outpatient talk therapy, initiate outpatient psychiatry.  Disposition: Discharge  Patrcia Dolly, FNP 08/07/2019, 2:02 PM

## 2019-08-07 NOTE — ED Notes (Signed)
BP rechecked manually in right arm. 132/86

## 2019-08-07 NOTE — Discharge Instructions (Addendum)

## 2019-08-07 NOTE — ED Notes (Signed)
Report [facility] after covid results  [phone]

## 2019-08-07 NOTE — ED Triage Notes (Signed)
[facility] transfer presents with Depression & anxiety.

## 2019-08-07 NOTE — ED Notes (Signed)
Pt belongings placed in locker 30 

## 2019-08-07 NOTE — BH Assessment (Signed)
Comprehensive Clinical Assessment (CCA) Note  08/07/2019 Dakota Weber [id number]  Pt is a 34 year old separated male who presents unaccompanied to [facility] ED reporting symptoms of depression and anxiety, including suicidal ideation. Pt states that he has felt increasingly depressed for the past two months and states, "Today I felt I was having a mental breakdown." When asked to describe his mood, Pt says, "in limbo." He says he experiencing problems with motivation and became overwhelmed by simply trying to cook a meal for himself. He says he contacted his therapist and "she kind of talked me off the ledge." Pt reports symptoms including crying spells, social withdrawal, loss of interest in usual pleasures, irritability, and feelings of hopelessness and worthlessness. He reports decreased sleep and appetite. He reports suicidal ideation and states he feels people would be better off if he were not here. He denies current plan or history of suicide attempt. Pt denies any history of intentional self-injurious behaviors. Pt denies current homicidal ideation or history of violence. Pt denies any history of auditory or visual hallucinations. Pt denies history of alcohol or other substance use.  Pt reports several stressors. He states he and his wife separated and he is staying alone in an apartment. 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 He describes taking FMLA from May to June. He says he returned to work but then had a bad reaction to the COVID vaccination and was out of work again. He returned to work and felt overwhelmed and has not returned for two weeks. Pt says he worries he will lose his apartment. Pt says he has family and friends he knows are supportive but doesn't always feel they are supportive. Pt denies legal problems. He denies access to firearms. Pt reports he sees Bobbe Medico for  therapy and would like to sign a release of information so she can participate in his care. He denies history of inpatient psychiatric treatment.   Pt is dressed in hospital scrubs, alert and oriented x4. Pt speaks in a clear tone, at moderate volume and normal pace. Motor behavior appears normal. Eye contact is good and Pt is tearful at times. Pt's mood is depressed and anxious, affect is congruent with mood. Thought process is coherent and relevant. There is no indication Pt is currently responding to internal stimuli or experiencing delusional thought content. Pt was cooperative throughout assessment. When asked if he was safe to return home and follow up with his therapist he appeared hesitant. He says he is willing to sign voluntarily into a psychiatric facility if recommended.  Visit Diagnosis:   F32.2 Major depressive disorder, Single episode, Severe     ICD-10-CM   1. Anxiety  F41.9      DISPOSITION: Gave clinical report to Renaye Rakers, NP who recommended Pt be observed overnight and evaluated by psychiatry in the morning so he can see his therapist later today. Pt accepted to [facility] Continuous Assessment pending COVID result. Notified Dr. Tilden Fossa and Adora Fridge, RN of recommendation.   PHQ9 SCORE ONLY 08/07/2019  PHQ-9 Total Score 10    CCA Screening, Triage and Referral (STR)  Patient Reported Information How did you hear about [place]? No data recorded Referral name: No data recorded Referral phone number: No data recorded  Whom do you see for routine medical problems? No data recorded Practice/Facility Name: No data recorded Practice/Facility Phone Number: No data  recorded Name of Contact: No data recorded Contact Number: No data recorded Contact Fax Number: No data recorded Prescriber Name: No data recorded Prescriber Address (if known): No data recorded  What Is the Reason for Your Visit/Call Today? No data recorded How Long Has This Been Causing You Problems? No  data recorded What Do You Feel Would Help You the Most Today? No data recorded  Have You Recently Been in Any Inpatient Treatment (Hospital/Detox/Crisis Center/28-Day Program)? No data recorded Name/Location of Program/Hospital:No data recorded How Long Were You There? No data recorded When Were You Discharged? No data recorded  Have You Ever Received Services From [facility] Before? No data recorded Who Do You See at [facility]? No data recorded  Have You Recently Had Any Thoughts About Hurting Yourself? No data recorded Are You Planning to Commit Suicide/Harm Yourself At This time? No data recorded  Have you Recently Had Thoughts About Hurting Someone Karolee Ohs? No data recorded Explanation: No data recorded  Have You Used Any Alcohol or Drugs in the Past 24 Hours? No data recorded How Long Ago Did You Use Drugs or Alcohol? No data recorded What Did You Use and How Much? No data recorded  Do You Currently Have a Therapist/Psychiatrist? No data recorded Name of Therapist/Psychiatrist: No data recorded  Have You Been Recently Discharged From Any Office Practice or Programs? No data recorded Explanation of Discharge From Practice/Program: No data recorded    CCA Screening Triage Referral Assessment Type of Contact: No data recorded Is this Initial or Reassessment? No data recorded Date Telepsych consult ordered in [facility]:  No data recorded Time Telepsych consult ordered in [facility]:  No data recorded  Patient Reported Information Reviewed? No data recorded Patient Left Without Being Seen? No data recorded Reason for Not Completing Assessment: No data recorded  Collateral Involvement: No data recorded  Does Patient Have a Court Appointed Legal Guardian? No data recorded Name and Contact of Legal Guardian: No data recorded If Minor and Not Living with Parent(s), Who has Custody? No data recorded Is CPS involved or ever been involved? No data recorded Is APS involved or ever been  involved? No data recorded  Patient Determined To Be At Risk for Harm To Self or Others Based on Review of Patient Reported Information or Presenting Complaint? No data recorded Method: No data recorded Availability of Means: No data recorded Intent: No data recorded Notification Required: No data recorded Additional Information for Danger to Others Potential: No data recorded Additional Comments for Danger to Others Potential: No data recorded Are There Guns or Other Weapons in Your Home? No data recorded Types of Guns/Weapons: No data recorded Are These Weapons Safely Secured?                            No data recorded Who Could Verify You Are Able To Have These Secured: No data recorded Do You Have any Outstanding Charges, Pending Court Dates, Parole/Probation? No data recorded Contacted To Inform of Risk of Harm To Self or Others: No data recorded  Location of Assessment: No data recorded  Does Patient Present under Involuntary Commitment? No data recorded IVC Papers Initial File Date: No data recorded  [place] of Residence: No data recorded  Patient Currently Receiving the Following Services: No data recorded  Determination of Need: No data recorded  Options For Referral: No data recorded    CCA Biopsychosocial  Intake/Chief Complaint:  CCA Intake With Chief Complaint  CCA Part Two Date: 08/07/19 CCA Part Two Time: 0015 Chief Complaint/Presenting Problem: Pt reports feeling anxious, depressed and that he is "having a mental breakdown." Patient's Currently Reported Symptoms/Problems: Crying spells, excessive worry, poor concentration, poor motivation, suicidal thoughts. Individual's Strengths: Pt is employed, motivated for treatment, has good support system Individual's Preferences: None identified Individual's Abilities: Technical knowledge Type of Services Patient Feels Are Needed: Pt is uncertain Initial Clinical Notes/Concerns: NA  Mental Health  Symptoms Depression:  Depression: Change in energy/activity, Difficulty Concentrating, Fatigue, Hopelessness, Increase/decrease in appetite, Irritability, Sleep (too much or little), Tearfulness, Duration of symptoms greater than two weeks  Mania:  Mania: None  Anxiety:   Anxiety: Difficulty concentrating, Fatigue, Irritability, Sleep, Tension, Worrying  Psychosis:  Psychosis: None  Trauma:  Trauma: None  Obsessions:  Obsessions: None  Compulsions:  Compulsions: None  Inattention:  Inattention: N/A  Hyperactivity/Impulsivity:  Hyperactivity/Impulsivity: N/A  Oppositional/Defiant Behaviors:     Emotional Irregularity:  Emotional Irregularity: N/A  Other Mood/Personality Symptoms:      Mental Status Exam Appearance and self-care  Stature:  Stature: Average  Weight:  Weight: Average weight  Clothing:  Clothing:  (Scrubs)  Grooming:  Grooming: Normal  Cosmetic use:  Cosmetic Use: None  Posture/gait:  Posture/Gait: Normal  Motor activity:  Motor Activity: Not Remarkable  Sensorium  Attention:  Attention: Normal  Concentration:  Concentration: Anxiety interferes  Orientation:  Orientation: X5  Recall/memory:  Recall/Memory: Normal  Affect and Mood  Affect:  Affect: Anxious, Depressed  Mood:  Mood: Anxious, Depressed  Relating  Eye contact:  Eye Contact: Normal  Facial expression:  Facial Expression: Depressed, Sad  Attitude toward examiner:  Attitude Toward Examiner: Cooperative  Thought and Language  Speech flow: Speech Flow: Clear and Coherent  Thought content:  Thought Content: Appropriate to Mood and Circumstances  Preoccupation:  Preoccupations: None  Hallucinations:  Hallucinations: None  Organization:     Company secretary of Knowledge:  Fund of Knowledge: Average  Intelligence:  Intelligence: Average  Abstraction:  Abstraction: Normal  Judgement:  Judgement: Normal  Reality Testing:  Reality Testing: Realistic  Insight:  Insight: Good  Decision Making:   Decision Making: Normal  Social Functioning  Social Maturity:  Social Maturity: Responsible  Social Judgement:  Social Judgement: Normal  Stress  Stressors:  Stressors: Relationship, Work, Housing  Coping Ability:  Coping Ability: Building surveyor Deficits:  Skill Deficits: None  Supports:  Supports: Family, Warehouse manager, Friends/Service system     Religion: Religion/Spirituality Are You A Religious Person?: Yes  Leisure/Recreation: Leisure / Recreation Do You Have Hobbies?: Yes Leisure and Hobbies: Land: Exercise/Diet Do You Exercise?: Yes What Type of Exercise Do You Do?: [employer] How Many Times a Week Do You Exercise?: 1-3 times a week Have You Gained or Lost A Significant Amount of Weight in the Past Six Months?: No Do You Follow a Special Diet?: No Do You Have Any Trouble Sleeping?: Yes Explanation of Sleeping Difficulties: Sleeping 5 hours   CCA Employment/Education  Employment/Work Situation: Employment / Work Situation Describe how patient's job has been impacted: Pt recently went on [employer] and hasn't worked in two weeks Has patient ever been in the [employer]?: No  Education: Education Is Patient Currently Attending School?: No   CCA Family/Childhood History  Family and Relationship History: Family history Marital status: Separated Separated, when?: Two months ago What types of issues is patient dealing with in the relationship?: Trust, frequent conflicts, past infidelity Does patient have children?: No  Childhood History:  Childhood History By whom was/is the patient raised?: Both parents Description of patient's relationship with caregiver when they were a child: Parents had frequent conflicts Patient's description of current relationship with people who raised him/her: Good How were you disciplined when you got in trouble as a child/adolescent?: "a whoopin'" Does patient have siblings?: Yes Number of Siblings: 2 Description of  patient's current relationship with siblings: Good Did patient suffer any verbal/emotional/physical/sexual abuse as a child?: No Did patient suffer from severe childhood neglect?: No Has patient ever been sexually abused/assaulted/raped as an adolescent or adult?: No Was the patient ever a victim of a crime or a disaster?: No Witnessed domestic violence?: No Has patient been affected by domestic violence as an adult?: No  Child/Adolescent Assessment:     CCA Substance Use  Alcohol/Drug Use: Alcohol / Drug Use Pain Medications: Denies abuse Prescriptions: Denies abuse Over the Counter: Denies abuse History of alcohol / drug use?: No history of alcohol / drug abuse Longest period of sobriety (when/how long): NA                         ASAM's:  Six Dimensions of Multidimensional Assessment  Dimension 1:  Acute Intoxication and/or Withdrawal Potential:      Dimension 2:  Biomedical Conditions and Complications:      Dimension 3:  Emotional, Behavioral, or Cognitive Conditions and Complications:     Dimension 4:  Readiness to Change:     Dimension 5:  Relapse, Continued use, or Continued Problem Potential:     Dimension 6:  Recovery/Living Environment:     ASAM Severity Score:    ASAM Recommended Level of Treatment:     Substance use Disorder (SUD)    Recommendations for Services/Supports/Treatments:    DSM5 Diagnoses: There are no problems to display for this patient.   Patient Centered Plan: Patient is on the following Treatment Plan(s):     Referrals to Alternative Service(s): Referred to Alternative Service(s):   Place:   Date:   Time:    Referred to Alternative Service(s):   Place:   Date:   Time:    Referred to Alternative Service(s):   Place:   Date:   Time:    Referred to Alternative Service(s):   Place:   Date:   Time:      Pamalee Leyden, [facility], [facility] Triage Specialist [phone]  Patsy [place], Harlin Rain

## 2019-08-14 DIAGNOSIS — F331 Major depressive disorder, recurrent, moderate: Secondary | ICD-10-CM | POA: Diagnosis not present

## 2019-08-21 DIAGNOSIS — F331 Major depressive disorder, recurrent, moderate: Secondary | ICD-10-CM | POA: Diagnosis not present

## 2019-08-29 DIAGNOSIS — F331 Major depressive disorder, recurrent, moderate: Secondary | ICD-10-CM | POA: Diagnosis not present

## 2020-07-14 ENCOUNTER — Other Ambulatory Visit: Payer: Self-pay

## 2020-07-14 ENCOUNTER — Emergency Department (HOSPITAL_COMMUNITY)
Admission: EM | Admit: 2020-07-14 | Discharge: 2020-07-14 | Disposition: A | Discharge status: Home / Self Care | Payer: BLUE CROSS/BLUE SHIELD | Attending: Emergency Medicine | Admitting: Emergency Medicine

## 2020-07-14 ENCOUNTER — Emergency Department (HOSPITAL_COMMUNITY): Payer: BLUE CROSS/BLUE SHIELD

## 2020-07-14 ENCOUNTER — Encounter (HOSPITAL_COMMUNITY): Payer: Self-pay

## 2020-07-14 DIAGNOSIS — R0789 Other chest pain: Secondary | ICD-10-CM | POA: Diagnosis not present

## 2020-07-14 DIAGNOSIS — R0602 Shortness of breath: Secondary | ICD-10-CM | POA: Insufficient documentation

## 2020-07-14 DIAGNOSIS — R55 Syncope and collapse: Secondary | ICD-10-CM | POA: Diagnosis not present

## 2020-07-14 LAB — D-DIMER, QUANTITATIVE: D-Dimer, Quant: <0.27 ug/mL-FEU (ref 0.00–0.50)

## 2020-07-14 LAB — CBC
HCT: 46.3 % (ref 39.0–52.0)
Hemoglobin: 15.4 g/dL (ref 13.0–17.0)
MCH: 25.8 pg — ABNORMAL LOW (ref 26.0–34.0)
MCHC: 33.3 g/dL (ref 30.0–36.0)
MCV: 77.7 fL — ABNORMAL LOW (ref 80.0–100.0)
Platelets: 203 10*3/uL (ref 150–400)
RBC: 5.96 MIL/uL — ABNORMAL HIGH (ref 4.22–5.81)
RDW: 13.9 % (ref 11.5–15.5)
WBC: 10.1 10*3/uL (ref 4.0–10.5)
nRBC: 0 % (ref 0.0–0.2)

## 2020-07-14 LAB — TROPONIN I (HIGH SENSITIVITY)
Troponin I (High Sensitivity): 9 ng/L (ref <18)
Troponin I (High Sensitivity): 11 ng/L (ref <18)

## 2020-07-14 LAB — BASIC METABOLIC PANEL
Anion gap: 10 (ref 5–15)
BUN: 10 mg/dL (ref 6–20)
CO2: 20 mmol/L — ABNORMAL LOW (ref 22–32)
Calcium: 9.1 mg/dL (ref 8.9–10.3)
Chloride: 106 mmol/L (ref 98–111)
Creatinine, Ser: 1.09 mg/dL (ref 0.61–1.24)
GFR, Estimated: >60 mL/min (ref >60)
Glucose, Bld: 101 mg/dL — ABNORMAL HIGH (ref 70–99)
Potassium: 4.1 mmol/L (ref 3.5–5.1)
Sodium: 136 mmol/L (ref 135–145)

## 2020-07-14 NOTE — Discharge Instructions (Signed)
Please read and follow all provided instructions.  Your diagnoses today include:  1. Syncope, unspecified syncope type   2. Chest tightness     Tests performed today include: An EKG of your heart A chest x-ray Cardiac enzymes - a blood test for heart muscle damage Blood counts and electrolytes D-dimer - screening test for blood clot - was negative Vital signs. See below for your results today.   Medications prescribed:  None  Take any prescribed medications only as directed.  Follow-up instructions: Please follow-up with your primary care provider as soon as you can for further evaluation of your symptoms.   Return instructions:  SEEK IMMEDIATE MEDICAL ATTENTION IF: You have severe chest pain, especially if the pain is crushing or pressure-like and spreads to the arms, back, neck, or jaw, or if you have sweating, nausea (feeling sick to your stomach), or shortness of breath. THIS IS AN EMERGENCY. Don't wait to see if the pain will go away. Get medical help at once. Call 911 or 0 (operator). DO NOT drive yourself to the hospital.  Your chest pain gets worse and does not go away with rest.  You have an attack of chest pain lasting longer than usual, despite rest and treatment with the medications your caregiver has prescribed.  You wake from sleep with chest pain or shortness of breath. You feel dizzy or faint. You have chest pain not typical of your usual pain for which you originally saw your caregiver.  You have any other emergent concerns regarding your health.  Additional Information: Chest pain comes from many different causes. Your caregiver has diagnosed you as having chest pain that is not specific for one problem, but does not require admission.  You are at low risk for an acute heart condition or other serious illness.   Your vital signs today were: BP (!) 138/100   Pulse 69   Temp 98.4 F (36.9 C)   Resp 14   SpO2 98%  If your blood pressure (BP) was elevated  above 135/85 this visit, please have this repeated by your doctor within one month. --------------

## 2020-07-14 NOTE — ED Notes (Signed)
Patient transported to X-ray 

## 2020-07-14 NOTE — ED Triage Notes (Signed)
Pt BIB [facility] d/t having a syncopal episode after he climbed a flight of stairs. Pt states that he does remember LOC occurring because he remembers waking up "on the floor" (per pt). Pt also reports feeling weak, at one point he described it as "a lead blanket" was laying on him. EMS reports pt A/Ox4, verbal, no trauma from fall/LOC. And EKG has inverted T-waves & frequent PVC's.  140/100, 80 bpm, 98% on RA, 18 Resp, CBG 92.

## 2020-07-14 NOTE — ED Provider Notes (Signed)
[facility] EMERGENCY DEPARTMENT Provider Note   CSN: [id number] Arrival date & time: 07/14/20  1742     History Chief Complaint  Patient presents with   Syncope   Abnormal EKG    Dakota Weber is a 35 y.o. male.  Patient is with no significant past medical history presents the emergency department for evaluation of shortness of breath and syncopal episode.  Patient states that sometime around noon today he began to feel short of breath and generally weak.  No associated chest pains, fever, cough.  He states at one point he was talking to a friend on the phone and then woke up on the floor.  No reported trauma.  EMS was called for transport to the hospital.  EKG had abnormalities noted.  Patient denies headache.  Patient denies signs of stroke including: facial droop, slurred speech, aphasia, weakness/numbness in extremities, imbalance/trouble walking. Patient denies risk factors for pulmonary embolism including: unilateral leg swelling, history of DVT/PE/other blood clots, use of exogenous hormones, recent immobilizations, recent surgery, recent travel (>4hr segment), malignancy, hemoptysis.         History reviewed. No pertinent past medical history.  There are no problems to display for this patient.   Past Surgical History:  Procedure Laterality Date   AMPUTATION Left 03/20/2012   Procedure: IRRIGATION AND DEBRIDEMENT REVISION AMPUTATION LEFT FINGER;  Surgeon: Tami Ribas, MD;  Location: [facility] OR;  Service: Orthopedics;  Laterality: Left;   I & D EXTREMITY Left 03/20/2012   Procedure: IRRIGATION LEFT LONG FINGER;  Surgeon: Tami Ribas, MD;  Location: [facility] OR;  Service: Orthopedics;  Laterality: Left;       Family History  Problem Relation Age of Onset   Cancer Father     Social History   Tobacco Use   Smoking status: Never   Smokeless tobacco: Never  Vaping Use   Vaping Use: Never used  Substance Use Topics   Alcohol use: No   Drug use: No     Home Medications Prior to Admission medications   Medication Sig Start Date End Date Taking? Authorizing Provider  citalopram (CELEXA) 10 MG tablet Take 10 mg by mouth daily. 07/23/19   [provider]    Allergies    Patient has no known allergies.  Review of Systems   Review of Systems  Constitutional:  Positive for fatigue. Negative for fever.  HENT:  Negative for rhinorrhea and sore throat.   Eyes:  Negative for redness.  Respiratory:  Positive for shortness of breath. Negative for cough.   Cardiovascular:  Negative for chest pain.  Gastrointestinal:  Negative for abdominal pain, diarrhea, nausea and vomiting.  Genitourinary:  Negative for dysuria and hematuria.  Musculoskeletal:  Negative for myalgias.  Skin:  Negative for rash.  Neurological:  Positive for syncope and weakness (generalized). Negative for headaches.   Physical Exam Updated Vital Signs BP (!) 130/98   Pulse 70   Temp 98.4 F (36.9 C)   Resp 15   SpO2 99%   Physical Exam Vitals and nursing note reviewed.  Constitutional:      Appearance: He is well-developed. He is not diaphoretic.  HENT:     Head: Normocephalic and atraumatic.     Mouth/Throat:     Mouth: Mucous membranes are not dry.  Eyes:     Conjunctiva/sclera: Conjunctivae normal.  Neck:     Vascular: Normal carotid pulses. No carotid bruit or JVD.     Trachea: Trachea normal. No tracheal  deviation.  Cardiovascular:     Rate and Rhythm: Normal rate and regular rhythm.     Pulses: No decreased pulses.          Radial pulses are 2+ on the right side and 2+ on the left side.     Heart sounds: Normal heart sounds, S1 normal and S2 normal. Heart sounds not distant. No murmur heard. Pulmonary:     Effort: Pulmonary effort is normal. No respiratory distress.     Breath sounds: Normal breath sounds. No wheezing.  Chest:     Chest wall: No tenderness.  Abdominal:     General: Bowel sounds are normal.     Palpations: Abdomen is  soft.     Tenderness: There is no abdominal tenderness. There is no guarding or rebound.  Musculoskeletal:     Cervical back: Normal range of motion and neck supple. No muscular tenderness.     Right lower leg: No edema.     Left lower leg: No edema.  Skin:    General: Skin is warm and dry.     Coloration: Skin is not pale.  Neurological:     Mental Status: He is alert. Mental status is at baseline.  Psychiatric:        Mood and Affect: Mood normal.    ED Results / Procedures / Treatments   Labs (all labs ordered are listed, but only abnormal results are displayed) Labs Reviewed  BASIC METABOLIC PANEL - Abnormal; Notable for the following components:      Result Value   CO2 20 (*)    Glucose, Bld 101 (*)    All other components within normal limits  CBC - Abnormal; Notable for the following components:   RBC 5.96 (*)    MCV 77.7 (*)    MCH 25.8 (*)    All other components within normal limits  D-DIMER, QUANTITATIVE  TROPONIN I (HIGH SENSITIVITY)  TROPONIN I (HIGH SENSITIVITY)    ED ECG REPORT   Date: 07/14/2020  Rate: 72  Rhythm: normal sinus rhythm  QRS Axis: indeterminate  Intervals: normal  ST/T Wave abnormalities: nonspecific T wave changes  Conduction Disutrbances:left anterior fascicular block  Narrative Interpretation:   Old EKG Reviewed: unchanged  I have personally reviewed the EKG tracing and agree with the computerized printout as noted.   Radiology DG Chest 2 View  Result Date: 07/14/2020 CLINICAL DATA:  Shortness of breath, syncope EXAM: CHEST - 2 VIEW COMPARISON:  07/27/2019 FINDINGS: The heart size and mediastinal contours are within normal limits. Both lungs are clear. The visualized skeletal structures are unremarkable. IMPRESSION: Normal study. Electronically Signed   By: Charlett Nose M.D.   On: 07/14/2020 19:14    Procedures Procedures   Medications Ordered in ED Medications - No data to display  ED Course  I have reviewed the triage  vital signs and the nursing notes.  Pertinent labs & imaging results that were available during my care of the patient were reviewed by me and considered in my medical decision making (see chart for details).  Patient seen and examined. Work-up initiated.  Cardiac work-up ordered including troponins.  Given syncopal episode and chest tightness, will check D-dimer as well.  EKG by EMS reviewed.  This is grossly abnormal, however I compared to several EKGs dating back to 2018 and they appear very similar.  Patient with inferolateral T wave inversions, biphasic T wave in precordial lead.  Vital signs reviewed and are as follows: BP (!) 143/94  Pulse 63   Temp 98.4 F (36.9 C)   Resp (!) 21   SpO2 99%   Initial work-up is negative including D-dimer and first troponin.  Awaiting second marker.  10:58 PM reviewed lab work with patient.  Discussed EKG abnormalities.  He continues to have minimal chest tightness, appears to be in no distress.  He states that he is under a lot of stress and working 2 jobs currently.  Currently no indications for admission.   I have encouraged him to follow-up with his primary care doctor.  I do not see any previous echocardiograms and this may be beneficial if any symptoms persist.  Patient was counseled to return with severe chest pain, especially if the pain is crushing or pressure-like and spreads to the arms, back, neck, or jaw, or if they have sweating, nausea, or shortness of breath with the pain. Also encouraged to return with any additional episodes of syncope. They were encouraged to call 911 with these symptoms.   The patient verbalized understanding and agreed.  He seems reliable to return with any worsening.   MDM Rules/Calculators/A&P                          Patient with syncope and chest tightness tonight.  Episode was unwitnessed.  No severe headache or neuro symptoms to suggest intracranial etiology.  Chest x-ray is clear without signs of edema or  enlargement of the cardiac shadow.  EKG is abnormal, but unchanged for at least the past 4 years.  No signs of prolonged QT, Brugada syndrome, reentrant tachycardia, heart block, other arrhythmia.  I do not see a previous echo for the patient.  He has not had any recurrent lightheaded or syncopal spells while in the emergency department.  Given stability of EKG, reassuring labs including D-dimer, troponin x2, lack of other cardiac risk factors --comfortable with patient being discharged with strict return instructions.  Encouraged PCP follow-up.  Final Clinical Impression(s) / ED Diagnoses Final diagnoses:  Syncope, unspecified syncope type  Chest tightness    Rx / DC Orders ED Discharge Orders     None        Renne Crigler, PA-C 07/14/20 2302    Little, Ambrose Finland, MD 07/15/20 0010

## 2020-10-24 ENCOUNTER — Emergency Department (HOSPITAL_COMMUNITY)
Admission: EM | Admit: 2020-10-24 | Discharge: 2020-10-24 | Disposition: A | Discharge status: Home / Self Care | Payer: BLUE CROSS/BLUE SHIELD | Attending: Emergency Medicine | Admitting: Emergency Medicine

## 2020-10-24 ENCOUNTER — Other Ambulatory Visit: Payer: Self-pay

## 2020-10-24 ENCOUNTER — Encounter (HOSPITAL_COMMUNITY): Payer: Self-pay

## 2020-10-24 ENCOUNTER — Emergency Department (HOSPITAL_COMMUNITY): Payer: BLUE CROSS/BLUE SHIELD

## 2020-10-24 DIAGNOSIS — R42 Dizziness and giddiness: Secondary | ICD-10-CM | POA: Diagnosis not present

## 2020-10-24 DIAGNOSIS — Z7982 Long term (current) use of aspirin: Secondary | ICD-10-CM | POA: Insufficient documentation

## 2020-10-24 DIAGNOSIS — I509 Heart failure, unspecified: Secondary | ICD-10-CM | POA: Insufficient documentation

## 2020-10-24 DIAGNOSIS — R0602 Shortness of breath: Secondary | ICD-10-CM | POA: Diagnosis not present

## 2020-10-24 LAB — COMPREHENSIVE METABOLIC PANEL
ALT: 16 U/L (ref 0–44)
AST: 20 U/L (ref 15–41)
Albumin: 4.1 g/dL (ref 3.5–5.0)
Alkaline Phosphatase: 53 U/L (ref 38–126)
Anion gap: 8 (ref 5–15)
BUN: 10 mg/dL (ref 6–20)
CO2: 27 mmol/L (ref 22–32)
Calcium: 9.7 mg/dL (ref 8.9–10.3)
Chloride: 103 mmol/L (ref 98–111)
Creatinine, Ser: 1.09 mg/dL (ref 0.61–1.24)
GFR, Estimated: >60 mL/min (ref >60)
Glucose, Bld: 72 mg/dL (ref 70–99)
Potassium: 3.7 mmol/L (ref 3.5–5.1)
Sodium: 138 mmol/L (ref 135–145)
Total Bilirubin: 0.6 mg/dL (ref 0.3–1.2)
Total Protein: 7.4 g/dL (ref 6.5–8.1)

## 2020-10-24 LAB — TROPONIN I (HIGH SENSITIVITY)
Troponin I (High Sensitivity): 17 ng/L (ref <18)
Troponin I (High Sensitivity): 16 ng/L (ref <18)

## 2020-10-24 LAB — CBC WITH DIFFERENTIAL/PLATELET
Abs Immature Granulocytes: 0.03 10*3/uL (ref 0.00–0.07)
Basophils Absolute: 0.1 10*3/uL (ref 0.0–0.1)
Basophils Relative: 1 %
Eosinophils Absolute: 0.1 10*3/uL (ref 0.0–0.5)
Eosinophils Relative: 1 %
HCT: 46.1 % (ref 39.0–52.0)
Hemoglobin: 15.3 g/dL (ref 13.0–17.0)
Immature Granulocytes: 0 %
Lymphocytes Relative: 22 %
Lymphs Abs: 2.1 10*3/uL (ref 0.7–4.0)
MCH: 25.9 pg — ABNORMAL LOW (ref 26.0–34.0)
MCHC: 33.2 g/dL (ref 30.0–36.0)
MCV: 78.1 fL — ABNORMAL LOW (ref 80.0–100.0)
Monocytes Absolute: 1 10*3/uL (ref 0.1–1.0)
Monocytes Relative: 11 %
Neutro Abs: 6.1 10*3/uL (ref 1.7–7.7)
Neutrophils Relative %: 65 %
Platelets: 199 10*3/uL (ref 150–400)
RBC: 5.9 MIL/uL — ABNORMAL HIGH (ref 4.22–5.81)
RDW: 13.6 % (ref 11.5–15.5)
WBC: 9.3 10*3/uL (ref 4.0–10.5)
nRBC: 0 % (ref 0.0–0.2)

## 2020-10-24 LAB — BRAIN NATRIURETIC PEPTIDE: B Natriuretic Peptide: 66.3 pg/mL (ref 0.0–100.0)

## 2020-10-24 MED ORDER — LACTATED RINGERS IV BOLUS
500.0000 mL | Freq: Once | INTRAVENOUS | Status: AC
  Administered 2020-10-24: 500 mL via INTRAVENOUS

## 2020-10-24 NOTE — Discharge Instructions (Signed)
All your testing today was reassuring.  We were able to contact your heart monitor company and confirm you are not having any arrhythmias.  Call your cardiologist tomorrow to schedule prompt follow-up.  Return to the ED with new or worsening symptoms including dizziness that does not resolve, loss of consciousness, chest pain, shortness of breath. In the meantime stay well hydrated and fed.

## 2020-10-24 NOTE — ED Provider Notes (Signed)
[facility] EMERGENCY DEPARTMENT Provider Note   CSN: [id number] Arrival date & time: 10/24/20  1315     History No chief complaint on file.   Dakota Weber is a 35 y.o. male.  HPI This is a 35 year old male with recently diagnosed HFrEF (35%) who presents with presyncope.  Patient was reportedly sitting at his desk when he suddenly started to feel lightheaded.  This was associated with shortness of breath.  Not associated with palpitations, chest pain, nausea, vomiting, or diaphoresis.  The feeling did not go away so patient called EMS.  Symptoms lasted about 30 minutes.  Patient was recently diagnosed with CHF and started on a number of new medications including metoprolol.  Reports compliance with all new medications.  No clear cause for his CHF at this point as he is early in his work-up.  Has a Holter monitor currently in place.    History reviewed. No pertinent past medical history.  There are no problems to display for this patient.   Past Surgical History:  Procedure Laterality Date   AMPUTATION Left 03/20/2012   Procedure: IRRIGATION AND DEBRIDEMENT REVISION AMPUTATION LEFT FINGER;  Surgeon: Tami Ribas, MD;  Location: [facility] OR;  Service: Orthopedics;  Laterality: Left;   I & D EXTREMITY Left 03/20/2012   Procedure: IRRIGATION LEFT LONG FINGER;  Surgeon: Tami Ribas, MD;  Location: [facility] OR;  Service: Orthopedics;  Laterality: Left;       Family History  Problem Relation Age of Onset   Cancer Father     Social History   Tobacco Use   Smoking status: Never   Smokeless tobacco: Never  Vaping Use   Vaping Use: Never used  Substance Use Topics   Alcohol use: No   Drug use: No    Home Medications Prior to Admission medications   Medication Sig Start Date End Date Taking? Authorizing Provider  aspirin 81 MG EC tablet Take 81 mg by mouth daily. 10/11/20 10/11/21 Yes [provider]  atorvastatin (LIPITOR) 20 MG tablet Take 20 mg by mouth at  bedtime. 10/11/20  Yes [provider]  calcium-vitamin D 250-100 MG-UNIT tablet Take 3 tablets by mouth daily.   Yes [provider]  lisinopril (ZESTRIL) 5 MG tablet Take 2.5 mg by mouth at bedtime. Hold if systolic blood pressure less than 105 10/15/20  Yes [provider]  metoprolol succinate (TOPROL-XL) 25 MG 24 hr tablet Take 12.5 mg by mouth at bedtime. 10/15/20  Yes [provider]  citalopram (CELEXA) 10 MG tablet Take 10 mg by mouth daily. Patient not taking: Reported on 10/24/2020 07/23/19   [provider]    Allergies    Ciprofloxacin  Review of Systems   Review of Systems  Constitutional:  Negative for chills and fever.  HENT:  Negative for ear pain and sore throat.   Eyes:  Negative for pain and visual disturbance.  Respiratory:  Positive for shortness of breath. Negative for cough.   Cardiovascular:  Negative for chest pain and palpitations.  Gastrointestinal:  Negative for abdominal pain and vomiting.  Genitourinary:  Negative for dysuria and hematuria.  Musculoskeletal:  Negative for arthralgias and back pain.  Skin:  Negative for color change and rash.  Neurological:  Positive for dizziness and light-headedness. Negative for seizures and syncope.  All other systems reviewed and are negative.  Physical Exam Updated Vital Signs BP (!) 134/94   Pulse (!) 55   Temp 98 F (36.7 C) (Oral)  Resp 17   SpO2 96%   Physical Exam Vitals and nursing note reviewed.  Constitutional:      Appearance: He is well-developed.  HENT:     Head: Normocephalic and atraumatic.  Eyes:     Conjunctiva/sclera: Conjunctivae normal.  Cardiovascular:     Rate and Rhythm: Normal rate and regular rhythm.     Heart sounds: No murmur heard. Pulmonary:     Effort: Pulmonary effort is normal. No respiratory distress.     Breath sounds: Normal breath sounds.  Abdominal:     Palpations: Abdomen is soft.     Tenderness: There is no abdominal  tenderness.  Musculoskeletal:        General: No swelling.     Cervical back: Neck supple.  Skin:    General: Skin is warm and dry.  Neurological:     Mental Status: He is alert.    ED Results / Procedures / Treatments   Labs (all labs ordered are listed, but only abnormal results are displayed) Labs Reviewed  CBC WITH DIFFERENTIAL/PLATELET - Abnormal; Notable for the following components:      Result Value   RBC 5.90 (*)    MCV 78.1 (*)    MCH 25.9 (*)    All other components within normal limits  BRAIN NATRIURETIC PEPTIDE  COMPREHENSIVE METABOLIC PANEL  CBG MONITORING, ED  TROPONIN I (HIGH SENSITIVITY)  TROPONIN I (HIGH SENSITIVITY)    EKG EKG Interpretation  Date/Time:  Monday October 24 2020 13:18:44 EDT Ventricular Rate:  61 PR Interval:  150 QRS Duration: 102 QT Interval:  394 QTC Calculation: 396 R Axis:   -86 Text Interpretation: Normal sinus rhythm Left anterior fascicular block  T wave changes similar to June 2022 Confirmed by Pricilla Loveless [phone]) on 10/24/2020 3:24:18 PM  Radiology DG Chest 2 View  Result Date: 10/24/2020 CLINICAL DATA:  Near syncope. EXAM: CHEST - 2 VIEW COMPARISON:  July 14, 2020. FINDINGS: The heart size and mediastinal contours are within normal limits. Both lungs are clear. No visible pleural effusions or pneumothorax. No acute osseous abnormality. No change from the prior. IMPRESSION: No evidence of acute cardiopulmonary disease. Electronically Signed   By: Feliberto Harts M.D.   On: 10/24/2020 14:38    Procedures Procedures   Medications Ordered in ED Medications  lactated ringers bolus 500 mL (0 mLs Intravenous Stopped 10/24/20 1837)    ED Course  I have reviewed the triage vital signs and the nursing notes.  Pertinent labs & imaging results that were available during my care of the patient were reviewed by me and considered in my medical decision making (see chart for details).    MDM Rules/Calculators/A&P                           Patient is hemodynamically stable and well-appearing on initial examination.  Exam is unremarkable and patient is asymptomatic at rest.  Items on the differential for presyncope include arrhythmia, ACS, CHF exacerbation, dehydration/orthostasis, metabolic disturbance, or anemia.  Also consider infection.   EKG with T wave inversions in the inferior and lateral leads, but consistent with EKG obtained in June.  Chest x-ray without pneumonia, pneumothorax, or pulmonary edema.  Troponin negative x2 and BNP normal. CBC and CMP are unremarkable.  No clear cause of patient's presyncopal symptoms but with CHF he is at risk of tachyarrhythmias.  Patient has a Holter monitor in place, able to reach the company by phone who  states that he has had no events since being on the monitor including no V. fib or V. tach.  Patient has had a very recent echo in has no signs of worsening of his congestive heart failure. Given 500 cc of fluids as patient states he has had decreased p.o. intake today, likely contributing to his presyncope.  No evidence of PE as patient does not have unilateral leg swelling, tachycardia, hypoxia or ongoing shortness of breath.  He has no pleuritic chest pain and no risk factors for PE.  PERC negative.  Patient is stable for discharge home.  He will call tomorrow to schedule prompt follow-up with his cardiologist.  Discharged home in stable condition.  Given strict return precautions.    Final Clinical Impression(s) / ED Diagnoses Final diagnoses:  Dizziness    Rx / DC Orders ED Discharge Orders     None        Doran Clay, MD 10/25/20 1829    Pricilla Loveless, MD 10/28/20 [phone]

## 2020-10-24 NOTE — ED Provider Notes (Signed)
Emergency Medicine Provider Triage Evaluation Note  Dakota Weber , a 35 y.o. male  was evaluated in triage.  Pt complains of fatigue lightheadedness and near syncopal episode at work today while sitting at his desk.'  Has been started on multiple medications approximately 1 week ago including metoprolol by cardiology found to have a low EF.  Denies any chest pain shortness of breath denies any vertigo.  Review of Systems  Positive: LH, near syncope, fatigue Negative: fever  Physical Exam  BP (!) 141/90 (BP Location: Right Arm)   Pulse 74   Temp 98 F (36.7 C) (Oral)   Resp 18   SpO2 99%  Gen:   Awake, no distress   Resp:  Normal effort  MSK:   Moves extremities without difficulty  Other:  RRR  Medical Decision Making  Medically screening exam initiated at 1:44 PM.  Appropriate orders placed.  Dakota Weber was informed that the remainder of the evaluation will be completed by another provider, this initial triage assessment does not replace that evaluation, and the importance of remaining in the ED until their evaluation is complete.  Well-appearing 35 year old male here after near syncopal episode.  Seems to have been fatigued most of today.  Recently started on medications that may have caused this also with low EF.   Dakota Weber, [place] 10/24/20 1346    Melene Plan, DO 10/25/20 [phone]

## 2020-10-24 NOTE — ED Triage Notes (Signed)
Patient arrived by [facility] with complaint of near syncope today. Has been seen at [facility] and found to have EF-35-40% last week following ECHO. Alert and oriented, NAD

## 2021-01-15 ENCOUNTER — Emergency Department (HOSPITAL_COMMUNITY): Payer: BLUE CROSS/BLUE SHIELD

## 2021-01-15 ENCOUNTER — Inpatient Hospital Stay (HOSPITAL_COMMUNITY)
Admission: EM | Admit: 2021-01-15 | Discharge: 2021-01-18 | DRG: 226 | Disposition: A | Discharge status: Home / Self Care | Payer: Self-pay | Attending: Internal Medicine | Admitting: Internal Medicine

## 2021-01-15 ENCOUNTER — Encounter (HOSPITAL_COMMUNITY): Payer: Self-pay

## 2021-01-15 ENCOUNTER — Other Ambulatory Visit: Payer: Self-pay

## 2021-01-15 DIAGNOSIS — Z8241 Family history of sudden cardiac death: Secondary | ICD-10-CM

## 2021-01-15 DIAGNOSIS — I472 Ventricular tachycardia, unspecified: Secondary | ICD-10-CM | POA: Diagnosis present

## 2021-01-15 DIAGNOSIS — I4729 Other ventricular tachycardia: Secondary | ICD-10-CM

## 2021-01-15 DIAGNOSIS — I459 Conduction disorder, unspecified: Secondary | ICD-10-CM

## 2021-01-15 DIAGNOSIS — R0781 Pleurodynia: Secondary | ICD-10-CM | POA: Diagnosis not present

## 2021-01-15 DIAGNOSIS — R55 Syncope and collapse: Secondary | ICD-10-CM | POA: Diagnosis present

## 2021-01-15 DIAGNOSIS — Z95810 Presence of automatic (implantable) cardiac defibrillator: Secondary | ICD-10-CM

## 2021-01-15 DIAGNOSIS — Y828 Other medical devices associated with adverse incidents: Secondary | ICD-10-CM | POA: Diagnosis not present

## 2021-01-15 DIAGNOSIS — I4901 Ventricular fibrillation: Secondary | ICD-10-CM | POA: Diagnosis present

## 2021-01-15 DIAGNOSIS — Z79899 Other long term (current) drug therapy: Secondary | ICD-10-CM

## 2021-01-15 DIAGNOSIS — I214 Non-ST elevation (NSTEMI) myocardial infarction: Secondary | ICD-10-CM

## 2021-01-15 DIAGNOSIS — Z7982 Long term (current) use of aspirin: Secondary | ICD-10-CM

## 2021-01-15 DIAGNOSIS — Z881 Allergy status to other antibiotic agents status: Secondary | ICD-10-CM

## 2021-01-15 DIAGNOSIS — Z20822 Contact with and (suspected) exposure to covid-19: Secondary | ICD-10-CM | POA: Diagnosis present

## 2021-01-15 DIAGNOSIS — R0602 Shortness of breath: Secondary | ICD-10-CM | POA: Diagnosis present

## 2021-01-15 DIAGNOSIS — I428 Other cardiomyopathies: Principal | ICD-10-CM | POA: Diagnosis present

## 2021-01-15 DIAGNOSIS — T82847A Pain from cardiac prosthetic devices, implants and grafts, initial encounter: Secondary | ICD-10-CM | POA: Diagnosis not present

## 2021-01-15 DIAGNOSIS — E785 Hyperlipidemia, unspecified: Secondary | ICD-10-CM | POA: Diagnosis present

## 2021-01-15 DIAGNOSIS — I493 Ventricular premature depolarization: Secondary | ICD-10-CM | POA: Diagnosis present

## 2021-01-15 LAB — COMPREHENSIVE METABOLIC PANEL
ALT: 19 U/L (ref 0–44)
AST: 25 U/L (ref 15–41)
Albumin: 4.6 g/dL (ref 3.5–5.0)
Alkaline Phosphatase: 53 U/L (ref 38–126)
Anion gap: 5 (ref 5–15)
BUN: 17 mg/dL (ref 6–20)
CO2: 26 mmol/L (ref 22–32)
Calcium: 9.7 mg/dL (ref 8.9–10.3)
Chloride: 107 mmol/L (ref 98–111)
Creatinine, Ser: 1.11 mg/dL (ref 0.61–1.24)
GFR, Estimated: >60 mL/min (ref >60)
Glucose, Bld: 85 mg/dL (ref 70–99)
Potassium: 3.7 mmol/L (ref 3.5–5.1)
Sodium: 138 mmol/L (ref 135–145)
Total Bilirubin: 0.9 mg/dL (ref 0.3–1.2)
Total Protein: 8.1 g/dL (ref 6.5–8.1)

## 2021-01-15 LAB — CBC
HCT: 41.8 % (ref 39.0–52.0)
Hemoglobin: 14.7 g/dL (ref 13.0–17.0)
MCH: 26.3 pg (ref 26.0–34.0)
MCHC: 35.2 g/dL (ref 30.0–36.0)
MCV: 74.9 fL — ABNORMAL LOW (ref 80.0–100.0)
Platelets: 219 10*3/uL (ref 150–400)
RBC: 5.58 MIL/uL (ref 4.22–5.81)
RDW: 13.7 % (ref 11.5–15.5)
WBC: 10.3 10*3/uL (ref 4.0–10.5)
nRBC: 0 % (ref 0.0–0.2)

## 2021-01-15 LAB — CBC WITH DIFFERENTIAL/PLATELET
Abs Immature Granulocytes: 0.03 10*3/uL (ref 0.00–0.07)
Basophils Absolute: 0 10*3/uL (ref 0.0–0.1)
Basophils Relative: 0 %
Eosinophils Absolute: 0 10*3/uL (ref 0.0–0.5)
Eosinophils Relative: 0 %
HCT: 42.7 % (ref 39.0–52.0)
Hemoglobin: 14.9 g/dL (ref 13.0–17.0)
Immature Granulocytes: 0 %
Lymphocytes Relative: 16 %
Lymphs Abs: 1.9 10*3/uL (ref 0.7–4.0)
MCH: 26.3 pg (ref 26.0–34.0)
MCHC: 34.9 g/dL (ref 30.0–36.0)
MCV: 75.4 fL — ABNORMAL LOW (ref 80.0–100.0)
Monocytes Absolute: 1.1 10*3/uL — ABNORMAL HIGH (ref 0.1–1.0)
Monocytes Relative: 9 %
Neutro Abs: 9 10*3/uL — ABNORMAL HIGH (ref 1.7–7.7)
Neutrophils Relative %: 75 %
Platelets: 223 10*3/uL (ref 150–400)
RBC: 5.66 MIL/uL (ref 4.22–5.81)
RDW: 13.9 % (ref 11.5–15.5)
WBC: 12.2 10*3/uL — ABNORMAL HIGH (ref 4.0–10.5)
nRBC: 0 % (ref 0.0–0.2)

## 2021-01-15 LAB — BRAIN NATRIURETIC PEPTIDE: B Natriuretic Peptide: 37.1 pg/mL (ref 0.0–100.0)

## 2021-01-15 LAB — CREATININE, SERUM
Creatinine, Ser: 1.14 mg/dL (ref 0.61–1.24)
GFR, Estimated: >60 mL/min (ref >60)

## 2021-01-15 LAB — RAPID URINE DRUG SCREEN, HOSP PERFORMED
Amphetamines: NOT DETECTED
Barbiturates: NOT DETECTED
Benzodiazepines: NOT DETECTED
Cocaine: NOT DETECTED
Opiates: NOT DETECTED
Tetrahydrocannabinol: NOT DETECTED

## 2021-01-15 LAB — RESP PANEL BY RT-PCR (FLU A&B, COVID) ARPGX2
Influenza A by PCR: NEGATIVE
Influenza B by PCR: NEGATIVE
SARS Coronavirus 2 by RT PCR: NEGATIVE

## 2021-01-15 LAB — ETHANOL: Alcohol, Ethyl (B): <10 mg/dL (ref <10)

## 2021-01-15 LAB — HIV ANTIBODY (ROUTINE TESTING W REFLEX): HIV Screen 4th Generation wRfx: NONREACTIVE

## 2021-01-15 LAB — MAGNESIUM: Magnesium: 2.1 mg/dL (ref 1.7–2.4)

## 2021-01-15 LAB — TROPONIN I (HIGH SENSITIVITY)
Troponin I (High Sensitivity): 77 ng/L — ABNORMAL HIGH (ref <18)
Troponin I (High Sensitivity): 74 ng/L — ABNORMAL HIGH (ref <18)

## 2021-01-15 MED ORDER — SODIUM CHLORIDE 0.9% FLUSH
3.0000 mL | Freq: Two times a day (BID) | INTRAVENOUS | Status: DC
  Administered 2021-01-15 – 2021-01-17 (×4): 3 mL via INTRAVENOUS

## 2021-01-15 MED ORDER — SODIUM CHLORIDE 0.9 % IV SOLN
250.0000 mL | INTRAVENOUS | Status: DC | PRN
  Administered 2021-01-17 – 2021-01-18 (×2): 250 mL via INTRAVENOUS

## 2021-01-15 MED ORDER — CITALOPRAM HYDROBROMIDE 10 MG PO TABS
10.0000 mg | ORAL_TABLET | Freq: Every day | ORAL | Status: DC
  Filled 2021-01-15 (×2): qty 1

## 2021-01-15 MED ORDER — ATORVASTATIN CALCIUM 10 MG PO TABS
20.0000 mg | ORAL_TABLET | Freq: Every day | ORAL | Status: DC
  Administered 2021-01-15 – 2021-01-17 (×3): 20 mg via ORAL
  Filled 2021-01-15 (×3): qty 2

## 2021-01-15 MED ORDER — ASPIRIN EC 81 MG PO TBEC
81.0000 mg | DELAYED_RELEASE_TABLET | Freq: Every day | ORAL | Status: DC
  Administered 2021-01-15 – 2021-01-18 (×4): 81 mg via ORAL
  Filled 2021-01-15 (×4): qty 1

## 2021-01-15 MED ORDER — EMPAGLIFLOZIN 10 MG PO TABS
10.0000 mg | ORAL_TABLET | Freq: Every day | ORAL | Status: DC
  Administered 2021-01-15 – 2021-01-18 (×4): 10 mg via ORAL
  Filled 2021-01-15 (×5): qty 1

## 2021-01-15 MED ORDER — METOPROLOL SUCCINATE ER 25 MG PO TB24
12.5000 mg | ORAL_TABLET | Freq: Every day | ORAL | Status: DC
  Administered 2021-01-15 – 2021-01-17 (×3): 12.5 mg via ORAL
  Filled 2021-01-15 (×3): qty 1

## 2021-01-15 MED ORDER — HEPARIN SODIUM (PORCINE) 5000 UNIT/ML IJ SOLN
5000.0000 [IU] | Freq: Three times a day (TID) | INTRAMUSCULAR | Status: DC
  Administered 2021-01-15 – 2021-01-17 (×8): 5000 [IU] via SUBCUTANEOUS
  Filled 2021-01-15 (×8): qty 1

## 2021-01-15 MED ORDER — ACETAMINOPHEN 325 MG PO TABS
650.0000 mg | ORAL_TABLET | Freq: Once | ORAL | Status: AC
  Administered 2021-01-15: 650 mg via ORAL
  Filled 2021-01-15: qty 2

## 2021-01-15 MED ORDER — OYSTER SHELL CALCIUM/D3 500-5 MG-MCG PO TABS
0.5000 | ORAL_TABLET | Freq: Every day | ORAL | Status: DC
  Administered 2021-01-15 – 2021-01-18 (×4): 0.5 via ORAL
  Filled 2021-01-15 (×7): qty 0.5

## 2021-01-15 MED ORDER — SACUBITRIL-VALSARTAN 24-26 MG PO TABS
1.0000 | ORAL_TABLET | Freq: Two times a day (BID) | ORAL | Status: DC
  Administered 2021-01-16 – 2021-01-18 (×4): 1 via ORAL
  Filled 2021-01-15 (×4): qty 1

## 2021-01-15 MED ORDER — SODIUM CHLORIDE 0.9% FLUSH: 3.0000 mL | INTRAVENOUS | Status: DC | PRN

## 2021-01-15 MED ORDER — CALCIUM CITRATE-VITAMIN D 250-100 MG-UNIT PO TABS: 3.0000 | ORAL_TABLET | Freq: Every day | ORAL | Status: DC

## 2021-01-15 MED ORDER — ONDANSETRON HCL 4 MG/2ML IJ SOLN: 4.0000 mg | Freq: Four times a day (QID) | INTRAMUSCULAR | Status: DC | PRN

## 2021-01-15 MED ORDER — ACETAMINOPHEN 325 MG PO TABS
650.0000 mg | ORAL_TABLET | ORAL | Status: DC | PRN
  Administered 2021-01-15 – 2021-01-16 (×2): 650 mg via ORAL
  Filled 2021-01-15 (×2): qty 2

## 2021-01-15 NOTE — ED Notes (Signed)
Report given to to [facility] 6E RN.[facility] called for transport at this time.

## 2021-01-15 NOTE — ED Triage Notes (Signed)
Pt arrives EMS from home. Seen at home earlier and called out for syncopal episode. Declined medical treatment. Called out several hours later with increased shob on exertion. Says possible recent dx of chf.

## 2021-01-15 NOTE — ED Notes (Signed)
Save blue tube in main lab °
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
## 2021-01-16 ENCOUNTER — Other Ambulatory Visit (HOSPITAL_COMMUNITY): Payer: Self-pay

## 2021-01-16 DIAGNOSIS — R55 Syncope and collapse: Secondary | ICD-10-CM

## 2021-01-16 DIAGNOSIS — E785 Hyperlipidemia, unspecified: Secondary | ICD-10-CM

## 2021-01-16 LAB — BASIC METABOLIC PANEL
Anion gap: 8 (ref 5–15)
BUN: 13 mg/dL (ref 6–20)
CO2: 25 mmol/L (ref 22–32)
Calcium: 9.3 mg/dL (ref 8.9–10.3)
Chloride: 103 mmol/L (ref 98–111)
Creatinine, Ser: 1.23 mg/dL (ref 0.61–1.24)
GFR, Estimated: >60 mL/min (ref >60)
Glucose, Bld: 95 mg/dL (ref 70–99)
Potassium: 3.9 mmol/L (ref 3.5–5.1)
Sodium: 136 mmol/L (ref 135–145)

## 2021-01-16 MED ORDER — EMPAGLIFLOZIN 10 MG PO TABS
10.0000 mg | ORAL_TABLET | Freq: Every day | ORAL | 1 refills | Status: AC
  Filled 2021-01-16: qty 30, 30d supply, fill #0

## 2021-01-16 MED ORDER — SACUBITRIL-VALSARTAN 24-26 MG PO TABS
1.0000 | ORAL_TABLET | Freq: Two times a day (BID) | ORAL | 1 refills | Status: DC
  Filled 2021-01-16: qty 60, 30d supply, fill #0

## 2021-01-16 MED ORDER — METOPROLOL SUCCINATE ER 25 MG PO TB24
12.5000 mg | ORAL_TABLET | Freq: Every day | ORAL | 0 refills | Status: DC
  Filled 2021-01-16: qty 15, 30d supply, fill #0

## 2021-01-16 MED ORDER — GUAIFENESIN ER 600 MG PO TB12
600.0000 mg | ORAL_TABLET | Freq: Two times a day (BID) | ORAL | Status: DC | PRN
  Administered 2021-01-16 – 2021-01-17 (×2): 600 mg via ORAL
  Filled 2021-01-16 (×2): qty 1

## 2021-01-16 NOTE — Progress Notes (Signed)
Progress Note  Patient Name: Dakota Weber Date of Encounter: 01/16/2021  [facility] Cardiologist: None   Subjective   NAEO. Tele shows sinus with occasional PVCs.   Inpatient Medications    Scheduled Meds:  aspirin EC  81 mg Oral Daily   atorvastatin  20 mg Oral QHS   calcium-vitamin D  0.5 tablet Oral Q breakfast   citalopram  10 mg Oral Daily   empagliflozin  10 mg Oral Daily   heparin  5,000 Units Subcutaneous Q8H   metoprolol succinate  12.5 mg Oral QHS   sacubitril-valsartan  1 tablet Oral BID   sodium chloride flush  3 mL Intravenous Q12H   Continuous Infusions:  sodium chloride     PRN Meds: sodium chloride, acetaminophen, guaiFENesin, ondansetron (ZOFRAN) IV, sodium chloride flush   Vital Signs    Vitals:   01/15/21 1300 01/15/21 2003 01/15/21 2356 01/16/21 0432  BP: 115/77 (!) 137/96 129/90 134/80  Pulse: 72 80 83 77  Resp: 20  17 18   Temp: 98.3 F (36.8 C) 97.6 F (36.4 C) 98.3 F (36.8 C) 98.6 F (37 C)  TempSrc: Oral Oral Oral Oral  SpO2: 96% 97% 97% 97%  Weight:    109.5 kg  Height:        Intake/Output Summary (Last 24 hours) at 01/16/2021 0842 Last data filed at 01/15/2021 2100 Gross per 24 hour  Intake 540 ml  Output --  Net 540 ml   Last 3 Weights 01/16/2021 01/15/2021 01/15/2021  Weight (lbs) 241 lb 6.4 oz 245 lb 9.5 oz 250 lb  Weight (kg) 109.498 kg 111.4 kg 113.399 kg      Telemetry    Sinus with occasional PVCs. No NSVT. - Personally Reviewed  ECG    Personally Reviewed  Physical Exam   GEN: No acute distress.   Neck: No JVD Cardiac: RRR, no murmurs, rubs, or gallops.  Respiratory: Clear to auscultation bilaterally. GI: Soft, nontender, non-distended  MS: No edema; No deformity. Neuro:  Nonfocal  Psych: Normal affect   Labs    High Sensitivity Troponin:   Recent Labs  Lab 01/15/21 0251 01/15/21 0610  TROPONINIHS 77* 74*     Chemistry Recent Labs  Lab 01/15/21 0251 01/15/21 0610 01/15/21 1027  01/16/21 0428  NA 138  --   --  136  K 3.7  --   --  3.9  CL 107  --   --  103  CO2 26  --   --  25  GLUCOSE 85  --   --  95  BUN 17  --   --  13  CREATININE 1.11  --  1.14 1.23  CALCIUM 9.7  --   --  9.3  MG  --  2.1  --   --   PROT 8.1  --   --   --   ALBUMIN 4.6  --   --   --   AST 25  --   --   --   ALT 19  --   --   --   ALKPHOS 53  --   --   --   BILITOT 0.9  --   --   --   GFRNONAA >60  --  >60 >60  ANIONGAP 5  --   --  8    Lipids No results for input(s): CHOL, TRIG, HDL, LABVLDL, LDLCALC, CHOLHDL in the last 168 hours.  Hematology Recent Labs  Lab 01/15/21 0251 01/15/21 1027  WBC 12.2* 10.3  RBC 5.66 5.58  HGB 14.9 14.7  HCT 42.7 41.8  MCV 75.4* 74.9*  MCH 26.3 26.3  MCHC 34.9 35.2  RDW 13.9 13.7  PLT 223 219   Thyroid No results for input(s): TSH, FREET4 in the last 168 hours.  BNP Recent Labs  Lab 01/15/21 0251  BNP 37.1    DDimer No results for input(s): DDIMER in the last 168 hours.   Radiology    DG Chest 2 View  Result Date: 01/15/2021 CLINICAL DATA:  Shortness of breath and syncope. EXAM: CHEST - 2 VIEW COMPARISON:  October 24, 2020 FINDINGS: The heart size and mediastinal contours are within normal limits. Both lungs are clear. The visualized skeletal structures are unremarkable. IMPRESSION: No active cardiopulmonary disease. Electronically Signed   By: Virgina Norfolk M.D.   On: 01/15/2021 03:18    Cardiac Studies   OSH cMR ordered - septal/anteroseptal LGE. EF < 35%.    Assessment & Plan   36 y.o. male with a PMHx of NICM, PVCs presented after a syncopal episode at home. Syncope was in the setting of significant physical exertion and stress and had prodromal symptoms of fatigue, lightheadedness but no increased palpitations. 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 I think he is at an increased risk of sudden cardiac death and an ICD is  indicated. We discussed the pathophysiology of his NICM, risk of sudden death and need for ICD. He is agreeable to this plan. I discussed using a LIfeVest as a bridge to ICD with his [facility] team vs staying here at [facility] for ICD later this week. He has been stable since arrival with no NSVT or sustained arrhythmias. He would like to discharge and follow closely with his [facility] team for ICD. I have scheduled an appointment with me in clinic as a "back-up" in case there is problems with his [facility] follow up.  #Syncope Unclear if most recent syncopal episode is arrhythmic given prodrome. I think exhaustion/fatigue in setting of high physical stress with a reduced EF is also possible. Will require Lifevest upon discharge. I did discuss staying at [facility] as well to get an ICD Prior to discharge but he wishes to discharge with the wearable defibrillator which I think is reasonable.  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 Defer to [facility] team for timing. - Lifevest > ICD as above     For questions or updates, please contact [facility] Please consult [email or web address] for contact info under        Signed, Vickie Epley, MD  01/16/2021, 8:42 AM

## 2021-01-16 NOTE — Plan of Care (Signed)
  Problem: Education: Goal: Knowledge of General Education information will improve Description: Including pain rating scale, medication(s)/side effects and non-pharmacologic comfort measures Outcome: Adequate for Discharge   

## 2021-01-16 NOTE — Care Management (Addendum)
1005 01-16-21 Case Manager faxed information to [employer] for Life Vest to see if patient is eligible. No further needs from Case Manager identified at this time.   01-16-21 1439 Case Manager received call from [employer] Rep that the insurance company is closed-unable to get insurance approval today. Patient is aware of the situation and that he will be staying overnight for insurance approval and fit tomorrow.

## 2021-01-16 NOTE — TOC Benefit Eligibility Note (Signed)
Patient Advocate Encounter  Prior Authorization for Jardiance 10 mg has been approved.    PA# [id number]   Patients co-pay is $0.00.     Roland Earl, CPhT Pharmacy Patient Advocate Specialist [facility] Patient Advocate Team Direct Number: [phone]  Fax: [phone]

## 2021-01-16 NOTE — TOC Benefit Eligibility Note (Signed)
Patient Product/process development scientist completed.    The patient is currently admitted and upon discharge could be taking Farxiga 10 mg.  Prior Authorization Required  The patient is currently admitted and upon discharge could be taking Jardiance 10 mg.  Prior Authorization Required  The patient is insured through [employer]    Roland Earl, CPhT Pharmacy Patient Advocate Specialist [facility] Patient Advocate Team Direct Number: [phone]  Fax: [phone]
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
## 2021-01-16 NOTE — Progress Notes (Signed)
Discharge instructions (including medications) discussed with and copy provided to patient/caregiver 

## 2021-01-17 ENCOUNTER — Inpatient Hospital Stay (HOSPITAL_COMMUNITY)
Admission: EM | Disposition: A | Discharge status: Home / Self Care | Payer: Self-pay | Source: Home / Self Care | Attending: Internal Medicine

## 2021-01-17 DIAGNOSIS — R55 Syncope and collapse: Secondary | ICD-10-CM

## 2021-01-17 DIAGNOSIS — I428 Other cardiomyopathies: Secondary | ICD-10-CM

## 2021-01-17 HISTORY — PX: ICD IMPLANT: EP1208

## 2021-01-17 LAB — BASIC METABOLIC PANEL
Anion gap: 9 (ref 5–15)
BUN: 17 mg/dL (ref 6–20)
CO2: 24 mmol/L (ref 22–32)
Calcium: 9.2 mg/dL (ref 8.9–10.3)
Chloride: 102 mmol/L (ref 98–111)
Creatinine, Ser: 1.19 mg/dL (ref 0.61–1.24)
GFR, Estimated: >60 mL/min (ref >60)
Glucose, Bld: 91 mg/dL (ref 70–99)
Potassium: 3.7 mmol/L (ref 3.5–5.1)
Sodium: 135 mmol/L (ref 135–145)

## 2021-01-17 LAB — SURGICAL PCR SCREEN
MRSA, PCR: NEGATIVE
Staphylococcus aureus: NEGATIVE

## 2021-01-17 SURGERY — ICD IMPLANT

## 2021-01-17 MED ORDER — CHLORHEXIDINE GLUCONATE 4 % EX LIQD
60.0000 mL | Freq: Once | CUTANEOUS | Status: AC
  Filled 2021-01-17: qty 60

## 2021-01-17 MED ORDER — LIDOCAINE HCL (PF) 1 % IJ SOLN
INTRAMUSCULAR | Status: AC
  Filled 2021-01-17: qty 60

## 2021-01-17 MED ORDER — SODIUM CHLORIDE 0.9 % IV SOLN
80.0000 mg | INTRAVENOUS | Status: AC
  Administered 2021-01-17: 80 mg

## 2021-01-17 MED ORDER — FENTANYL CITRATE (PF) 100 MCG/2ML IJ SOLN
INTRAMUSCULAR | Status: AC
  Filled 2021-01-17: qty 2

## 2021-01-17 MED ORDER — FENTANYL CITRATE (PF) 100 MCG/2ML IJ SOLN
INTRAMUSCULAR | Status: DC | PRN
  Administered 2021-01-17: 25 ug via INTRAVENOUS
  Administered 2021-01-17: 12.5 ug via INTRAVENOUS
  Administered 2021-01-17: 25 ug via INTRAVENOUS

## 2021-01-17 MED ORDER — ONDANSETRON HCL 4 MG/2ML IJ SOLN: 4.0000 mg | Freq: Four times a day (QID) | INTRAMUSCULAR | Status: DC | PRN

## 2021-01-17 MED ORDER — MIDAZOLAM HCL 5 MG/5ML IJ SOLN
INTRAMUSCULAR | Status: DC | PRN
  Administered 2021-01-17 (×2): 2 mg via INTRAVENOUS
  Administered 2021-01-17 (×2): 1 mg via INTRAVENOUS

## 2021-01-17 MED ORDER — SODIUM CHLORIDE 0.9 % IV SOLN
INTRAVENOUS | Status: AC
  Filled 2021-01-17: qty 2

## 2021-01-17 MED ORDER — VANCOMYCIN HCL IN DEXTROSE 1-5 GM/200ML-% IV SOLN
INTRAVENOUS | Status: AC
  Filled 2021-01-17: qty 200

## 2021-01-17 MED ORDER — HEPARIN (PORCINE) IN NACL 1000-0.9 UT/500ML-% IV SOLN
INTRAVENOUS | Status: AC
  Filled 2021-01-17: qty 500

## 2021-01-17 MED ORDER — SODIUM CHLORIDE 0.9% FLUSH: 3.0000 mL | Freq: Two times a day (BID) | INTRAVENOUS | Status: DC

## 2021-01-17 MED ORDER — CEFAZOLIN SODIUM-DEXTROSE 1-4 GM/50ML-% IV SOLN
1.0000 g | Freq: Four times a day (QID) | INTRAVENOUS | Status: AC
  Administered 2021-01-17 – 2021-01-18 (×2): 1 g via INTRAVENOUS
  Filled 2021-01-17 (×4): qty 50

## 2021-01-17 MED ORDER — SODIUM CHLORIDE 0.9 % IV SOLN
250.0000 mL | INTRAVENOUS | Status: DC
  Administered 2021-01-17: 250 mL via INTRAVENOUS

## 2021-01-17 MED ORDER — MIDAZOLAM HCL 5 MG/5ML IJ SOLN
INTRAMUSCULAR | Status: AC
  Filled 2021-01-17: qty 5

## 2021-01-17 MED ORDER — CHLORHEXIDINE GLUCONATE 4 % EX LIQD
60.0000 mL | Freq: Once | CUTANEOUS | Status: AC
  Administered 2021-01-17: 4 via TOPICAL
  Filled 2021-01-17: qty 118
  Filled 2021-01-17: qty 60

## 2021-01-17 MED ORDER — ACETAMINOPHEN 325 MG PO TABS
325.0000 mg | ORAL_TABLET | ORAL | Status: DC | PRN
  Administered 2021-01-17 – 2021-01-18 (×2): 650 mg via ORAL
  Filled 2021-01-17 (×2): qty 2

## 2021-01-17 MED ORDER — SODIUM CHLORIDE 0.9 % IV SOLN: INTRAVENOUS | Status: DC

## 2021-01-17 MED ORDER — SODIUM CHLORIDE 0.9% FLUSH: 3.0000 mL | INTRAVENOUS | Status: DC | PRN

## 2021-01-17 MED ORDER — CEFAZOLIN SODIUM-DEXTROSE 2-4 GM/100ML-% IV SOLN
2.0000 g | INTRAVENOUS | Status: AC
  Administered 2021-01-17: 2 g via INTRAVENOUS

## 2021-01-17 MED ORDER — CEFAZOLIN SODIUM-DEXTROSE 2-4 GM/100ML-% IV SOLN
INTRAVENOUS | Status: AC
  Filled 2021-01-17: qty 100

## 2021-01-17 SURGICAL SUPPLY — 6 items
CABLE SURGICAL S-101-97-12 (CABLE) ×2 IMPLANT
ICD ACTICOR DX (ICD Generator) ×1 IMPLANT
LEAD PLEXA 65/15 (Lead) ×1 IMPLANT
PAD DEFIB RADIO PHYSIO CONN (PAD) ×2 IMPLANT
SHEATH 8FR PRELUDE SNAP 13 (SHEATH) ×1 IMPLANT
TRAY PACEMAKER INSERTION (PACKS) ×2 IMPLANT

## 2021-01-17 NOTE — Progress Notes (Addendum)
Progress Note  Patient Name: Dakota Weber Date of Encounter: 01/17/2021  [facility] Cardiologist: Wille Glaser, MD   Subjective   No complaints  Inpatient Medications    Scheduled Meds:  aspirin EC  81 mg Oral Daily   atorvastatin  20 mg Oral QHS   calcium-vitamin D  0.5 tablet Oral Q breakfast   citalopram  10 mg Oral Daily   empagliflozin  10 mg Oral Daily   heparin  5,000 Units Subcutaneous Q8H   metoprolol succinate  12.5 mg Oral QHS   sacubitril-valsartan  1 tablet Oral BID   sodium chloride flush  3 mL Intravenous Q12H   Continuous Infusions:  sodium chloride     PRN Meds: sodium chloride, acetaminophen, guaiFENesin, ondansetron (ZOFRAN) IV, sodium chloride flush   Vital Signs    Vitals:   01/16/21 0432 01/16/21 2156 01/17/21 0500 01/17/21 0600  BP: 134/80 (!) 133/95  110/85  Pulse: 77 87  65  Resp: 18 17  17   Temp: 98.6 F (37 C) 98 F (36.7 C)  98.3 F (36.8 C)  TempSrc: Oral Oral  Oral  SpO2: 97% 98%  97%  Weight: 109.5 kg  108.7 kg   Height:       No intake or output data in the 24 hours ending 01/17/21 0929 Last 3 Weights 01/17/2021 01/16/2021 01/15/2021  Weight (lbs) 239 lb 11.2 oz 241 lb 6.4 oz 245 lb 9.5 oz  Weight (kg) 108.727 kg 109.498 kg 111.4 kg      Telemetry    SR, occ PVCs - Personally Reviewed  ECG    No new EKGs - Personally Reviewed  Physical Exam   GEN: No acute distress.   Neck: No JVD Cardiac: RRR, no murmurs, rubs, or gallops.  Respiratory: CTA b/l. GI: Soft, nontender, non-distended  MS: No edema; No deformity. Neuro:  Nonfocal  Psych: Normal affect   Labs    High Sensitivity Troponin:   Recent Labs  Lab 01/15/21 0251 01/15/21 0610  TROPONINIHS 77* 74*     Chemistry Recent Labs  Lab 01/15/21 0251 01/15/21 0610 01/15/21 1027 01/16/21 0428 01/17/21 0314  NA 138  --   --  136 135  K 3.7  --   --  3.9 3.7  CL 107  --   --  103 102  CO2 26  --   --  25 24  GLUCOSE 85  --   --  95 91  BUN 17   --   --  13 17  CREATININE 1.11  --  1.14 1.23 1.19  CALCIUM 9.7  --   --  9.3 9.2  MG  --  2.1  --   --   --   PROT 8.1  --   --   --   --   ALBUMIN 4.6  --   --   --   --   AST 25  --   --   --   --   ALT 19  --   --   --   --   ALKPHOS 53  --   --   --   --   BILITOT 0.9  --   --   --   --   GFRNONAA >60  --  >60 >60 >60  ANIONGAP 5  --   --  8 9    Lipids No results for input(s): CHOL, TRIG, HDL, LABVLDL, LDLCALC, CHOLHDL in the last 168 hours.  Hematology Recent  Labs  Lab 01/15/21 0251 01/15/21 1027  WBC 12.2* 10.3  RBC 5.66 5.58  HGB 14.9 14.7  HCT 42.7 41.8  MCV 75.4* 74.9*  MCH 26.3 26.3  MCHC 34.9 35.2  RDW 13.9 13.7  PLT 223 219   Thyroid No results for input(s): TSH, FREET4 in the last 168 hours.  BNP Recent Labs  Lab 01/15/21 0251  BNP 37.1    DDimer No results for input(s): DDIMER in the last 168 hours.   Radiology    No results found.  Cardiac Studies   12/28/20: c.MRI ([facility]) IMPRESSION:   1.  Dilated left ventricle with marked hypokinesia and ejection fraction of 28%.  2.  Delayed mid-myocardial enhancement which can be seen with nonischemic dilated cardiomyopathy. Myocarditis and sarcoid would be additional considerations FINDINGS:   LEFT VENTRICLE:  - Normal size and wall thickness.  - Severe global dyskinesia.  - Calculated ejection fraction of 28%.  - No myocardial edema.   RIGHT VENTRICLE:  - Normal size and wall thickness.  - Normal wall motion.  - Calculated right ventricular ejection fraction of 49%.   LEFT ATRIUM:  - 3.4 cm in AP dimension.  - No filling defect.  - The left atrial appendage is poorly visualized.   RIGHT ATRIUM:  - Normal size.  - No filling defect.   AORTIC VALVE:  - Trileaflet.  - No regurgitant or stenotic flow jets.   MITRAL VALVE:  - Grossly normal.  - No regurgitant or stenotic flow jets.   TRICUSPID VALVE:  - Grossly normal.  - No regurgitant or stenotic flow jets.   PULMONIC VALVE:  -  Grossly normal.  - No regurgitant or stenotic flow jets.   PERICARDIUM:  - There is normal pericardial thickness.  - No pericardial effusion or mass.   DELAYED MYOCARDIAL ENHANCEMENT:  - There is abnormal delayed mid-myocardial enhancement within the septal wall at the mid ventricle, extending into the anteroseptal wall towards the apex.   MEASUREMENTS:  - LV EDV: 327 mL. LV ESV: 236 mL. LV SV: 91 mL.  - LV ED anteroseptal wall thickness (measured on short axis view): 1 cm.  - LV ED internal diameter (measured on short axis view): 6.2 cm.  - Aortic root: 3.5 cm.    11/03/20: Coronary CT IMPRESSION:  -  Calcium score of 0.    -  Mildly reduced LV systolic function (EF not accurately calculated).  -  Normal origin and trajectory of coronary arteries.  -  No coronary plaque or stenosis.  -  An addendum commenting on non-cardiac structures to follow.    This is an over read of the noncardiac portions of the coronary CTA.  Comparison: None  - Mediastinum: Normal heart size. No adenopathy. No acute vascular abnormalities..  - Visualized lung fields: Trace atelectasis or dependent edema in the lung bases..  - Visualized abdomen: No significant abnormality.  - Bones: No acute or aggressive bony abnormality.  - Other: N/A    10/14/2020: TTE Left Atrium: Left atrium is borderline dilated.    Left Ventricle: There is moderate global hypokinesis of the left  ventricle.    Left Ventricle: Left ventricle is moderately dilated.    Left Ventricle: Doppler parameters indicate normal diastolic function.    Left Ventricle: Systolic function is moderately abnormal. EF: 35-40%.    Tricuspid Valve: The right ventricular systolic pressure is normal (<36  mmHg).   No significant valvular disease was noted.  Patient Profile  36 y.o. male NICM, PVCs, NSVT admitted with syncope  Assessment & Plan    Syncope NICM EP saw the patient yesterday, Dr. Lalla Brothers discussed: "Syncope was in the  setting of significant physical exertion and stress and had prodromal symptoms of fatigue, lightheadedness but no increased palpitations. 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 I think he is at an increased risk of sudden cardiac death and an ICD is indicated" Patient preferred to discharge and follow up with his cardiology team at [facility] with a life vest to be placed prior to discharge Discharge held pending auth for the vest Today the patient though has decided that he would prefer to pursue ICD implant while here  I discussed ICD implant procedure as well as potential complications, he would like to proceed He has had breakfast Will look towards the afternoon if schedule allows  EKG with narrow QRS  Dr. Ladona Ridgel will see him later today  For questions or updates, please contact [facility] Please consult [email or web address] for contact info under   Signed, Sheilah Pigeon, PA-C  01/17/2021, 9:29 AM    EP Attending  Patient seen and examined. Agree with above. The patient has presented with LV dysfunction and syncope. ICD has been recommended by Dr. Lalla Brothers. He initially wanted to go home and come back but decided to stay and undergo ICD insertion. I have discussed the indications/risks/benefits/goals/expectations and he wishes to proceed.   Sharlot Gowda Russia Scheiderer,MD
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
## 2021-01-18 ENCOUNTER — Encounter (HOSPITAL_COMMUNITY): Payer: Self-pay | Admitting: Internal Medicine

## 2021-01-18 ENCOUNTER — Inpatient Hospital Stay (HOSPITAL_COMMUNITY): Payer: BLUE CROSS/BLUE SHIELD

## 2021-01-18 ENCOUNTER — Other Ambulatory Visit (HOSPITAL_COMMUNITY): Payer: Self-pay

## 2021-01-18 ENCOUNTER — Encounter (HOSPITAL_COMMUNITY)
Admission: EM | Disposition: A | Discharge status: Home / Self Care | Payer: Self-pay | Source: Home / Self Care | Attending: Internal Medicine

## 2021-01-18 DIAGNOSIS — R55 Syncope and collapse: Secondary | ICD-10-CM

## 2021-01-18 DIAGNOSIS — T82120A Displacement of cardiac electrode, initial encounter: Secondary | ICD-10-CM

## 2021-01-18 HISTORY — PX: LEAD REVISION/REPAIR: EP1213

## 2021-01-18 LAB — BASIC METABOLIC PANEL WITH GFR
Anion gap: 8 (ref 5–15)
BUN: 12 mg/dL (ref 6–20)
CO2: 23 mmol/L (ref 22–32)
Calcium: 9.3 mg/dL (ref 8.9–10.3)
Chloride: 105 mmol/L (ref 98–111)
Creatinine, Ser: 1 mg/dL (ref 0.61–1.24)
GFR, Estimated: >60 mL/min
Glucose, Bld: 87 mg/dL (ref 70–99)
Potassium: 4.2 mmol/L (ref 3.5–5.1)
Sodium: 136 mmol/L (ref 135–145)

## 2021-01-18 LAB — ECHOCARDIOGRAM LIMITED
Calc EF: 38.2 %
Height: 73 in
S' Lateral: 4.6 cm
Single Plane A2C EF: 34.6 %
Single Plane A4C EF: 41.1 %
Weight: 3844.82 oz

## 2021-01-18 SURGERY — LEAD REVISION/REPAIR

## 2021-01-18 MED ORDER — LIDOCAINE HCL (PF) 1 % IJ SOLN
INTRAMUSCULAR | Status: DC | PRN
  Administered 2021-01-18: 40 mL

## 2021-01-18 MED ORDER — CHLORHEXIDINE GLUCONATE 4 % EX LIQD
60.0000 mL | Freq: Once | CUTANEOUS | Status: DC
  Filled 2021-01-18: qty 60

## 2021-01-18 MED ORDER — CEFAZOLIN SODIUM-DEXTROSE 2-4 GM/100ML-% IV SOLN
2.0000 g | INTRAVENOUS | Status: AC
  Administered 2021-01-18: 2 g via INTRAVENOUS
  Filled 2021-01-18: qty 100

## 2021-01-18 MED ORDER — KETOROLAC TROMETHAMINE 30 MG/ML IJ SOLN
30.0000 mg | Freq: Once | INTRAMUSCULAR | Status: AC
  Administered 2021-01-18: 30 mg via INTRAVENOUS
  Filled 2021-01-18: qty 1

## 2021-01-18 MED ORDER — SODIUM CHLORIDE 0.9 % IV SOLN
INTRAVENOUS | Status: AC
  Filled 2021-01-18: qty 2

## 2021-01-18 MED ORDER — SODIUM CHLORIDE 0.9 % IV SOLN
80.0000 mg | INTRAVENOUS | Status: AC
  Administered 2021-01-18: 80 mg
  Filled 2021-01-18: qty 2

## 2021-01-18 MED ORDER — MIDAZOLAM HCL 5 MG/5ML IJ SOLN
INTRAMUSCULAR | Status: AC
  Filled 2021-01-18: qty 5

## 2021-01-18 MED ORDER — FENTANYL CITRATE (PF) 100 MCG/2ML IJ SOLN
INTRAMUSCULAR | Status: AC
  Filled 2021-01-18: qty 2

## 2021-01-18 MED ORDER — LIDOCAINE HCL (PF) 1 % IJ SOLN
INTRAMUSCULAR | Status: AC
  Filled 2021-01-18: qty 30

## 2021-01-18 MED ORDER — MIDAZOLAM HCL 5 MG/5ML IJ SOLN
INTRAMUSCULAR | Status: DC | PRN
  Administered 2021-01-18 (×2): 1 mg via INTRAVENOUS
  Administered 2021-01-18: 2 mg via INTRAVENOUS

## 2021-01-18 MED ORDER — CHLORHEXIDINE GLUCONATE 4 % EX LIQD: 60.0000 mL | Freq: Once | CUTANEOUS | Status: DC

## 2021-01-18 MED ORDER — CEFAZOLIN SODIUM-DEXTROSE 2-4 GM/100ML-% IV SOLN
INTRAVENOUS | Status: AC
  Filled 2021-01-18: qty 100

## 2021-01-18 MED ORDER — SODIUM CHLORIDE 0.9 % IV SOLN: INTRAVENOUS | Status: DC

## 2021-01-18 MED ORDER — SODIUM CHLORIDE 0.9 % IV SOLN
INTRAVENOUS | Status: DC | PRN
  Administered 2021-01-18: 80 mg

## 2021-01-18 MED ORDER — HEPARIN (PORCINE) IN NACL 1000-0.9 UT/500ML-% IV SOLN
INTRAVENOUS | Status: AC
  Filled 2021-01-18: qty 500

## 2021-01-18 MED ORDER — FENTANYL CITRATE (PF) 100 MCG/2ML IJ SOLN
INTRAMUSCULAR | Status: DC | PRN
  Administered 2021-01-18: 12.5 ug via INTRAVENOUS
  Administered 2021-01-18: 25 ug via INTRAVENOUS
  Administered 2021-01-18: 12.5 ug via INTRAVENOUS

## 2021-01-18 MED ORDER — HEPARIN (PORCINE) IN NACL 1000-0.9 UT/500ML-% IV SOLN
INTRAVENOUS | Status: DC | PRN
  Administered 2021-01-18: 500 mL

## 2021-01-18 MED FILL — Lidocaine HCl Local Preservative Free (PF) Inj 1%: INTRAMUSCULAR | Qty: 90 | Status: AC

## 2021-01-18 MED FILL — Vancomycin HCl For IV Soln 1 GM (Base Equivalent): INTRAVENOUS | Qty: 1000 | Status: AC

## 2021-01-18 SURGICAL SUPPLY — 4 items
CABLE SURGICAL S-101-97-12 (CABLE) ×2 IMPLANT
MAT PREVALON FULL STRYKER (MISCELLANEOUS) ×1 IMPLANT
PAD DEFIB RADIO PHYSIO CONN (PAD) ×2 IMPLANT
TRAY PACEMAKER INSERTION (PACKS) ×2 IMPLANT

## 2021-01-18 NOTE — Discharge Instructions (Addendum)
° ° °  Supplemental Discharge Instructions for  Pacemaker/Defibrillator Patients  Tomorrow, 01/20/20, send in a device transmission  Activity No heavy lifting or vigorous activity with your left/right arm for 6 to 8 weeks.  Do not raise your left/right arm above your head for one week.  Gradually raise your affected arm as drawn below.             01/23/21                      01/24/21                    01/25/21                    01/26/21 __  NO DRIVING for  6 months.  WOUND CARE Keep the wound area clean and dry.  Do not get this area wet , no showers for one week; you may shower on  01/26/21  . Tomorrow, 01/19/21 remove the LARGE outer plastic bandage.  Underneath the plastic bandage there are steri strips (paper tapes), DO NOT remove these. The tape/steri-strips on your wound will fall off; do not pull them off.  No bandage is needed on the site.  DO  NOT apply any creams, oils, or ointments to the wound area. If you notice any drainage or discharge from the wound, any swelling or bruising at the site, or you develop a fever > 101? F after you are discharged home, call the office at once.  Special Instructions You are still able to use cellular telephones; use the ear opposite the side where you have your pacemaker/defibrillator.  Avoid carrying your cellular phone near your device. When traveling through airports, show security personnel your identification card to avoid being screened in the metal detectors.  Ask the security personnel to use the hand wand. Avoid arc welding equipment, MRI testing (magnetic resonance imaging), TENS units (transcutaneous nerve stimulators).  Call the office for questions about other devices. Avoid electrical appliances that are in poor condition or are not properly grounded. Microwave ovens are safe to be near or to operate.  Additional information for defibrillator patients should your device go off: If your device goes off ONCE and you feel fine afterward,  notify the device clinic nurses. If your device goes off ONCE and you do not feel well afterward, call 911. If your device goes off TWICE, call 911. If your device goes off THREE times in one day, call 911.  DO NOT DRIVE YOURSELF OR A FAMILY MEMBER WITH A DEFIBRILLATOR TO THE HOSPITAL--CALL 911.

## 2021-01-18 NOTE — Progress Notes (Signed)
°  Echocardiogram 2D Echocardiogram has been performed.  Dakota Weber 01/18/2021, 9:16 AM

## 2021-01-18 NOTE — Progress Notes (Signed)
Patient taken to cath lab at 1100hrs via bed.

## 2021-01-18 NOTE — Progress Notes (Signed)
Patient returned from cath lab at 1249hrs.  Small ooze noted on right shoulder dressing.  Right arm in sling. +2 radial pulse.

## 2021-01-18 NOTE — Progress Notes (Addendum)
Progress Note  Patient Name: Dakota Weber Date of Encounter: 01/18/2021  [facility] Cardiologist: Marina Goodell, MD   Subjective   I'm having CP  Inpatient Medications    Scheduled Meds:  aspirin EC  81 mg Oral Daily   atorvastatin  20 mg Oral QHS   calcium-vitamin D  0.5 tablet Oral Q breakfast   citalopram  10 mg Oral Daily   empagliflozin  10 mg Oral Daily   metoprolol succinate  12.5 mg Oral QHS   sacubitril-valsartan  1 tablet Oral BID   sodium chloride flush  3 mL Intravenous Q12H   Continuous Infusions:  sodium chloride 10 mL/hr at 01/18/21 0619    ceFAZolin (ANCEF) IV Stopped (01/18/21 0542)   PRN Meds: sodium chloride, acetaminophen, guaiFENesin, ondansetron (ZOFRAN) IV, sodium chloride flush   Vital Signs    Vitals:   01/17/21 1759 01/17/21 2135 01/18/21 0619 01/18/21 0932  BP: 115/77  (!) 124/99   Pulse:  97 100 100  Resp:  16 16 16   Temp:  98.1 F (36.7 C) 98.1 F (36.7 C) 98.4 F (36.9 C)  TempSrc:  Oral Oral Oral  SpO2: 95%  96%   Weight:   109 kg   Height:        Intake/Output Summary (Last 24 hours) at 01/18/2021 1001 Last data filed at 01/18/2021 [id number] Gross per 24 hour  Intake 425.27 ml  Output --  Net 425.27 ml   Last 3 Weights 01/18/2021 01/17/2021 01/16/2021  Weight (lbs) 240 lb 4.8 oz 239 lb 11.2 oz 241 lb 6.4 oz  Weight (kg) 109 kg 108.727 kg 109.498 kg      Telemetry    SR, occ PVCs - Personally Reviewed  ECG    SR 94bpm - Personally Reviewed  Physical Exam   GEN: No acute distress.   Neck: No JVD Cardiac: RRR, no murmurs, no rubs, or gallops.  Respiratory: CTA b/l. GI: Soft, nontender, non-distended  MS: No edema; No deformity. Neuro:  Nonfocal  Psych: Normal affect   ICD site is stable, no hematoma  Labs    High Sensitivity Troponin:   Recent Labs  Lab 01/15/21 0251 01/15/21 0610  TROPONINIHS 77* 74*     Chemistry Recent Labs  Lab 01/15/21 0251 01/15/21 0610 01/15/21 1027 01/16/21 0428  01/17/21 0314 01/18/21 0332  NA 138  --   --  136 135 136  K 3.7  --   --  3.9 3.7 4.2  CL 107  --   --  103 102 105  CO2 26  --   --  25 24 23   GLUCOSE 85  --   --  95 91 87  BUN 17  --   --  13 17 12   CREATININE 1.11  --    < > 1.23 1.19 1.00  CALCIUM 9.7  --   --  9.3 9.2 9.3  MG  --  2.1  --   --   --   --   PROT 8.1  --   --   --   --   --   ALBUMIN 4.6  --   --   --   --   --   AST 25  --   --   --   --   --   ALT 19  --   --   --   --   --   ALKPHOS 53  --   --   --   --   --  BILITOT 0.9  --   --   --   --   --   GFRNONAA >60  --    < > >60 >60 >60  ANIONGAP 5  --   --  8 9 8    < > = values in this interval not displayed.    Lipids No results for input(s): CHOL, TRIG, HDL, LABVLDL, LDLCALC, CHOLHDL in the last 168 hours.  Hematology Recent Labs  Lab 01/15/21 0251 01/15/21 1027  WBC 12.2* 10.3  RBC 5.66 5.58  HGB 14.9 14.7  HCT 42.7 41.8  MCV 75.4* 74.9*  MCH 26.3 26.3  MCHC 34.9 35.2  RDW 13.9 13.7  PLT 223 219   Thyroid No results for input(s): TSH, FREET4 in the last 168 hours.  BNP Recent Labs  Lab 01/15/21 0251  BNP 37.1    DDimer No results for input(s): DDIMER in the last 168 hours.   Radiology    DG Chest 2 View  Result Date: 01/18/2021 CLINICAL DATA:  36 year old male with a history ICD implant EXAM: CHEST - 2 VIEW COMPARISON:  01/15/2021 FINDINGS: Cardiomediastinal silhouette unchanged in size and contour. Interval placement of left chest wall cardiac AICD/pacer with single lead via subclavian approach. No visualized pneumothorax. No evidence of central vascular congestion. No interlobular septal thickening. No pleural effusion. No confluent airspace disease. No acute displaced fracture IMPRESSION: Interval placement of left chest wall cardiac AICD with no complicating features Electronically Signed   By: Gilmer Mor D.O.   On: 01/18/2021 08:25   EP PPM/ICD IMPLANT  Result Date: 01/17/2021 Conclusion: Successful ICD implantation in a patient  with a nonischemic cardiomyopathy, strong family history of sudden cardiac death, ejection fraction of 20%, and syncope worrisome for ventricular tachycardia. Lewayne Bunting, MD    Cardiac Studies   12/28/20: c.MRI ([facility]) IMPRESSION:   1.  Dilated left ventricle with marked hypokinesia and ejection fraction of 28%.  2.  Delayed mid-myocardial enhancement which can be seen with nonischemic dilated cardiomyopathy. Myocarditis and sarcoid would be additional considerations FINDINGS:   LEFT VENTRICLE:  - Normal size and wall thickness.  - Severe global dyskinesia.  - Calculated ejection fraction of 28%.  - No myocardial edema.   RIGHT VENTRICLE:  - Normal size and wall thickness.  - Normal wall motion.  - Calculated right ventricular ejection fraction of 49%.   LEFT ATRIUM:  - 3.4 cm in AP dimension.  - No filling defect.  - The left atrial appendage is poorly visualized.   RIGHT ATRIUM:  - Normal size.  - No filling defect.   AORTIC VALVE:  - Trileaflet.  - No regurgitant or stenotic flow jets.   MITRAL VALVE:  - Grossly normal.  - No regurgitant or stenotic flow jets.   TRICUSPID VALVE:  - Grossly normal.  - No regurgitant or stenotic flow jets.   PULMONIC VALVE:  - Grossly normal.  - No regurgitant or stenotic flow jets.   PERICARDIUM:  - There is normal pericardial thickness.  - No pericardial effusion or mass.   DELAYED MYOCARDIAL ENHANCEMENT:  - There is abnormal delayed mid-myocardial enhancement within the septal wall at the mid ventricle, extending into the anteroseptal wall towards the apex.   MEASUREMENTS:  - LV EDV: 327 mL. LV ESV: 236 mL. LV SV: 91 mL.  - LV ED anteroseptal wall thickness (measured on short axis view): 1 cm.  - LV ED internal diameter (measured on short axis view): 6.2 cm.  - Aortic root: 3.5  cm.    11/03/20: Coronary CT IMPRESSION:  -  Calcium score of 0.    -  Mildly reduced LV systolic function (EF not accurately calculated).   -  Normal origin and trajectory of coronary arteries.  -  No coronary plaque or stenosis.  -  An addendum commenting on non-cardiac structures to follow.    This is an over read of the noncardiac portions of the coronary CTA.  Comparison: None  - Mediastinum: Normal heart size. No adenopathy. No acute vascular abnormalities..  - Visualized lung fields: Trace atelectasis or dependent edema in the lung bases..  - Visualized abdomen: No significant abnormality.  - Bones: No acute or aggressive bony abnormality.  - Other: N/A    10/14/2020: TTE Left Atrium: Left atrium is borderline dilated.    Left Ventricle: There is moderate global hypokinesis of the left  ventricle.    Left Ventricle: Left ventricle is moderately dilated.    Left Ventricle: Doppler parameters indicate normal diastolic function.    Left Ventricle: Systolic function is moderately abnormal. EF: 35-40%.    Tricuspid Valve: The right ventricular systolic pressure is normal (<36  mmHg).   No significant valvular disease was noted.  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 Quentin Ore discussed: "Syncope was in the setting of significant physical exertion and stress and had prodromal symptoms of fatigue, lightheadedness but no increased palpitations. 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 I think he is at an increased risk of sudden cardiac death and an ICD is indicated"  Patient is s/p ICD implant yesterday Last evening started to develop central chest heaviness This AM he noticed that when laying more supine was worse, sitting up better, and standing even better Deep inspiration worsens it Seemed to have some radiation to back, but also better when  No SOB HR/BP stable Stat echo this AM without large pericardial  effusion (unofficially read by one of our general cardiologists)  Device  check this AM with stable measurements CXR is without ptx  Likely microperforation  Hemodynamically stable without tamponade or large pericardial effusion Dr. Lovena Le has seen and examined the patient, recommends lead revision, the patient is agreeable  NPO after breakfast    For questions or updates, please contact [facility] Please consult [email or web address] for contact info under   Signed, Baldwin Jamaica, PA-C  01/18/2021, 10:01 AM    EP Attending  Despite satisfactory numbers on device interrogation and good lead position on CXR, he c/o pleuritic chest pressure, better with standing and worse with sitting. The pain is described as dull and includes the back. Worse with a deep breath. At this point I think lead revision is indicated. We will plan to proceed later today.  Carleene Overlie Javaun Dimperio,MD

## 2021-01-19 ENCOUNTER — Encounter (HOSPITAL_COMMUNITY): Payer: Self-pay | Admitting: Internal Medicine

## 2021-01-23 ENCOUNTER — Other Ambulatory Visit (HOSPITAL_COMMUNITY): Payer: Self-pay

## 2021-01-23 MED ORDER — METOPROLOL SUCCINATE ER 25 MG PO TB24
25.0000 mg | ORAL_TABLET | Freq: Two times a day (BID) | ORAL | 5 refills | Status: DC
  Filled 2021-01-23: qty 60, 30d supply, fill #0

## 2021-02-01 ENCOUNTER — Ambulatory Visit (INDEPENDENT_AMBULATORY_CARE_PROVIDER_SITE_OTHER): Payer: BLUE CROSS/BLUE SHIELD

## 2021-02-01 ENCOUNTER — Other Ambulatory Visit: Payer: Self-pay

## 2021-02-01 DIAGNOSIS — I428 Other cardiomyopathies: Secondary | ICD-10-CM

## 2021-02-01 NOTE — Patient Instructions (Signed)
° °  After Your ICD (Implantable Cardiac Defibrillator)    Monitor your defibrillator site for redness, swelling, and drainage. Call the device clinic at [phone] if you experience these symptoms or fever/chills.  Please do not shower until your return for your 1 week wound check appointment 02/08/21 at 9:20 am   Do not lift, push or pull greater than 10 pounds with the affected arm until 6 weeks after your procedure. There are no other restrictions in arm movement after your wound check appointment. Feb 15  Your ICD is MRI compatible.  Your ICD is designed to protect you from life threatening heart rhythms. Because of this, you may receive a shock.   1 shock with no symptoms:  Call the office during business hours. 1 shock with symptoms (chest pain, chest pressure, dizziness, lightheadedness, shortness of breath, overall feeling unwell):  Call 911. If you experience 2 or more shocks in 24 hours:  Call 911. If you receive a shock, you should not drive.  [place] DMV - no driving for 6 months if you receive appropriate therapy from your ICD.   ICD Alerts:  Some alerts are vibratory and others beep. These are NOT emergencies. Please call our office to let us know. If this occurs at night or on weekends, it can wait until the next business day. Send a remote transmission.  If your device is capable of reading fluid status (for heart failure), you will be offered monthly monitoring to review this with you.   Remote monitoring is used to monitor your ICD from home. This monitoring is scheduled every 91 days by our office. It allows [place] to keep an eye on the functioning of your device to ensure it is working properly. You will routinely see your Electrophysiologist annually (more often if necessary).

## 2021-02-02 LAB — CUP PACEART INCLINIC DEVICE CHECK
Brady Statistic RV Percent Paced: 0 %
Date Time Interrogation Session: 20230119133104
Implantable Lead Implant Date: 20230103
Implantable Lead Location: 753860
Implantable Lead Model: 436909
Implantable Lead Serial Number: 81418533
Implantable Pulse Generator Implant Date: 20230103
Pulse Gen Model: 429525
Pulse Gen Serial Number: 84879015

## 2021-02-02 NOTE — Progress Notes (Signed)
Wound check appointment. Steri-strips removed. Wound without redness or edema. Incision edges approximated, wound well healed. Small suture abcess noted medially. Able to remove. Discussed with Dr. Lalla Brothers who advised to re-steri strip area and return for wound check in 1 week.  Normal device function. Thresholds, sensing, and impedances consistent with implant measurements. Device programmed at 3.0V for extra safety margin until 3 month visit. Histogram distribution appropriate for patient and level of activity. No ventricular arrhythmias noted. Patient educated about wound care, arm mobility, lifting restrictions, shock plan. Will return 02/10/21 at 9:20 am for wound check and 91 day follow up with Dr. Lalla Brothers 04/21/21. Error in retrieving from programmer. See attachment in paceart for testing results.

## 2021-02-10 ENCOUNTER — Other Ambulatory Visit: Payer: Self-pay

## 2021-02-10 ENCOUNTER — Ambulatory Visit (INDEPENDENT_AMBULATORY_CARE_PROVIDER_SITE_OTHER): Payer: BLUE CROSS/BLUE SHIELD

## 2021-02-10 DIAGNOSIS — I428 Other cardiomyopathies: Secondary | ICD-10-CM

## 2021-02-10 NOTE — Progress Notes (Signed)
Patient in office for wound recheck.  2 steri strips removed.  Site free of s/s of infection.  No redness, swelling or drainage preset.  Previously noted stitch abscess has closed.  Patient provided verbal consent for site to be photographed and entered into chart.

## 2021-02-20 ENCOUNTER — Other Ambulatory Visit (HOSPITAL_COMMUNITY): Payer: Self-pay

## 2021-03-31 ENCOUNTER — Telehealth: Payer: Self-pay

## 2021-03-31 NOTE — Telephone Encounter (Addendum)
Biotronik alert received for tachycardia with unknown duration.  Patient called and reports asymptomatic. States he was active during this time and does not have any complaints. Patient is not taking Toprol-XL as prescribed per med list. States he is only taking Toprol-XL 50mg  daily. Also not complaint with Entresto. Stressed compliance, and I wold verify his Toprol-XL dose with Dr. . Voiced understanding. Advised to call with further questions or concerns.  ? ? ? ? ? ? ? ? ?

## 2021-04-19 LAB — CUP PACEART REMOTE DEVICE CHECK
Battery Voltage: 3.13 V
Brady Statistic RV Percent Paced: 0 %
Date Time Interrogation Session: 20230405092923
HighPow Impedance: 72 Ohm
Implantable Lead Implant Date: 20230103
Implantable Lead Location: 753860
Implantable Lead Model: 436909
Implantable Lead Serial Number: 81418533
Implantable Pulse Generator Implant Date: 20230103
Lead Channel Impedance Value: 520 Ohm
Lead Channel Pacing Threshold Amplitude: 0.7 V
Lead Channel Pacing Threshold Pulse Width: 0.4 ms
Lead Channel Sensing Intrinsic Amplitude: 7.2 mV
Lead Channel Sensing Intrinsic Amplitude: 8 mV
Pulse Gen Model: 429525
Pulse Gen Serial Number: 84879015

## 2021-04-20 ENCOUNTER — Ambulatory Visit (INDEPENDENT_AMBULATORY_CARE_PROVIDER_SITE_OTHER): Payer: Self-pay

## 2021-04-20 DIAGNOSIS — I428 Other cardiomyopathies: Secondary | ICD-10-CM

## 2021-04-21 ENCOUNTER — Ambulatory Visit (INDEPENDENT_AMBULATORY_CARE_PROVIDER_SITE_OTHER): Payer: BLUE CROSS/BLUE SHIELD | Admitting: Cardiology

## 2021-04-21 ENCOUNTER — Encounter: Payer: Self-pay | Admitting: Cardiology

## 2021-04-21 VITALS — BP 132/88 | HR 59 | Ht 73.0 in | Wt 240.2 lb

## 2021-04-21 DIAGNOSIS — Z79899 Other long term (current) drug therapy: Secondary | ICD-10-CM | POA: Diagnosis not present

## 2021-04-21 DIAGNOSIS — I428 Other cardiomyopathies: Secondary | ICD-10-CM

## 2021-04-21 DIAGNOSIS — Z95810 Presence of automatic (implantable) cardiac defibrillator: Secondary | ICD-10-CM

## 2021-04-21 MED ORDER — ENTRESTO 49-51 MG PO TABS: 1.0000 | ORAL_TABLET | Freq: Two times a day (BID) | ORAL | 3 refills | Status: DC

## 2021-04-21 NOTE — Progress Notes (Signed)
?Electrophysiology Office Note:   ? ?Date:  04/21/2021  ? ?ID:  Dakota Weber, DOB 1985-11-02, MRN [id number] ? ?PCP:  Filomena Jungling, NP  ?[facility] Cardiologist:  Wille Glaser, MD  ?[facility] Electrophysiologist:  None  ? ?Referring MD: Filomena Jungling, NP  ? ?Chief Complaint: In-clinic ICD follow-up ? ?History of Present Illness:   ? ?Dakota Weber is a 36 y.o. male who presents for follow-up of ICD Bio implant on 01/18/2021, performed by Dr. Ladona Ridgel. Their medical history includes NICM, PVCs, NSVT. ? ?He was admitted to the hospital 01/15/2021 following a syncopal episode and shortness of breath. His syncope occurred during stress with no prodrome, and there were concerns for ventricular tachycardia per his cardiologist Dr. Houston Siren at [place]. Workup at [facility] includes cardiac MRI which demonstrated an EF of 28%, RVEF 49%, and abnormal delayed mid myocardial enhancement within the septum at the mid ventricle extending into the anteroseptal wall towards the apex. Given some delayed enhancement, myocarditis or sarcoid should be considered. Troponin on admission was mildly elevated at 77 and 74.  BNP was only 37.1.  Potassium 3.7, magnesium 2.1. His lisinopril was switched to Entresto 24-26 BID along with Jardiance, and Toprol was reduced to 12.5 mg daily. He agreed to proceed with ICD implant 01/18/2020 with Dr. Ladona Ridgel. ? ?Overall, he has been feeling very well. His energy levels have been improving with his nightly CPAP use.  ? ?Lately he has not been feeling many thumping palpitations. He has noticed improvement in his ICD incision, which continues to heal well. There is no pain associated with his device. ? ?However, he complains of randomly intermittent right-sided stabbing chest pain, most recent episode was last week. Typical duration is about a few minutes before resolving spontaneously. Of note, he states he has had ongoing stabbing chest pains in his right side for as long as he can remember.    ? ?When he goes to work he will check his BP twice a day. In the past few weeks his BP has averaged 120-130/72-88. ? ?Typically he tries to stay as active as possible. When exercising, his heart rate may increase to the 160s. If he pushes himself further his heart rate is as high as the 180s. ? ?Currently taking 1 tablet of 50 mg Toprol-XL, changed due to insurance coverage. ? ?He denies any shortness of breath, or peripheral edema. No lightheadedness, headaches, syncope, orthopnea, or PND. ? ? ?  ?No past medical history on file. ? ?Past Surgical History:  ?Procedure Laterality Date  ? AMPUTATION Left 03/20/2012  ? Procedure: IRRIGATION AND DEBRIDEMENT REVISION AMPUTATION LEFT FINGER;  Surgeon: Tami Ribas, MD;  Location: [facility] OR;  Service: Orthopedics;  Laterality: Left;  ? I & D EXTREMITY Left 03/20/2012  ? Procedure: IRRIGATION LEFT LONG FINGER;  Surgeon: Tami Ribas, MD;  Location: [facility] OR;  Service: Orthopedics;  Laterality: Left;  ? ICD IMPLANT N/A 01/17/2021  ? Procedure: ICD IMPLANT;  Surgeon: Marinus Maw, MD;  Location: [facility] INVASIVE CV LAB;  Service: Cardiovascular;  Laterality: N/A;  ? LEAD REVISION/REPAIR N/A 01/18/2021  ? Procedure: LEAD REVISION/REPAIR;  Surgeon: Marinus Maw, MD;  Location: [facility] INVASIVE CV LAB;  Service: Cardiovascular;  Laterality: N/A;  ? ? ?Current Medications: ?No outpatient medications have been marked as taking for the 04/21/21 encounter (Appointment) with Lanier Prude, MD.  ?  ? ?Allergies:   Ciprofloxacin  ? ?Social History  ? ?Socioeconomic History  ? Marital status: Divorced  ?  Spouse  name: Not on file  ? Number of children: Not on file  ? Years of education: Not on file  ? Highest education level: Not on file  ?Occupational History  ? Not on file  ?Tobacco Use  ? Smoking status: Never  ? Smokeless tobacco: Never  ?Vaping Use  ? Vaping Use: Never used  ?Substance and Sexual Activity  ? Alcohol use: No  ? Drug use: No  ? Sexual activity: Yes  ?Other Topics Concern  ? Not  on file  ?Social History Narrative  ? Bachelors degree/ apt with wife/ R handed/ occasional caffeine/likes to fish  ? ?Social Determinants of Health  ? ?Financial Resource Strain: Not on file  ?Food Insecurity: Not on file  ?Transportation Needs: Not on file  ?Physical Activity: Not on file  ?Stress: Not on file  ?Social Connections: Not on file  ?  ? ?Family History: ?The patient's family history includes Cancer in his father. ? ?ROS:   ?Please see the history of present illness.    ?(+) Stabbing chest pain, ongoing ?All other systems reviewed and are negative. ? ?EKGs/Labs/Other Studies Reviewed:   ? ?The following studies were reviewed today: ? ?NM HeartSpect 02/09/2021 ([facility]): ?INDICATION: evaluate for cardiac sarcoidosis (Part of PET protocol)  ? ?FINDINGS:Small perfusion defect at the cardiac apex  ? ?Left ventricular wall motion: Not evaluated.  ? ?IMPRESSION:  ? ?Small cardiac apex defect.  ? ?Lead Revision/Repair 01/18/2021: ?Conclusion: Successful ICD lead revision in a patient who had symptoms of microperforation after his initial implant. ? ?Echo 01/18/2021: ? 1. No pericardial effusion visualized. RV free wall not well visualized  ?but based on limited views, there does not appear to be a significant  ?effusion.  ? 2. Left ventricular ejection fraction, by estimation, is 30 to 35%. The  ?left ventricle demonstrates global hypokinesis. The left ventricular  ?internal cavity size was mildly dilated.  ? 3. The mitral valve is normal in structure. Trivial mitral valve  ?regurgitation.  ? 4. Aortic dilatation noted. There is mild dilatation of the aortic root,  ?measuring 39 mm.  ? 5. The inferior vena cava is normal in size with greater than 50%  ?respiratory variability, suggesting right atrial pressure of 3 mmHg. ? ?ICD Implant 01/17/2021: ?Conclusion: Successful ICD implantation in a patient with a nonischemic cardiomyopathy, strong family history of sudden cardiac death, ejection fraction of 20%, and syncope  worrisome for ventricular tachycardia. ? ?Cardiac MRI 12/28/2020 ([facility]): ?FINDINGS:  ? ?LEFT VENTRICLE:  ?- Normal size and wall thickness.  ?- Severe global dyskinesia.  ?- Calculated ejection fraction of 28%.  ?- No myocardial edema.  ? ?RIGHT VENTRICLE:  ?- Normal size and wall thickness.  ?- Normal wall motion.  ?- Calculated right ventricular ejection fraction of 49%.  ? ?LEFT ATRIUM:  ?- 3.4 cm in AP dimension.  ?- No filling defect.  ?- The left atrial appendage is poorly visualized.  ? ?RIGHT ATRIUM:  ?- Normal size.  ?- No filling defect.  ? ?AORTIC VALVE:  ?- Trileaflet.  ?- No regurgitant or stenotic flow jets.  ? ?MITRAL VALVE:  ?- Grossly normal.  ?- No regurgitant or stenotic flow jets.  ? ?TRICUSPID VALVE:  ?- Grossly normal.  ?- No regurgitant or stenotic flow jets.  ? ?PULMONIC VALVE:  ?- Grossly normal.  ?- No regurgitant or stenotic flow jets.  ? ?PERICARDIUM:  ?- There is normal pericardial thickness.  ?- No pericardial effusion or mass.  ? ?DELAYED MYOCARDIAL ENHANCEMENT:  ?-  There is abnormal delayed mid-myocardial enhancement within the septal wall at the mid ventricle, extending into the anteroseptal wall towards the apex.  ? ?MEASUREMENTS:  ?- LV EDV: 327 mL. LV ESV: 236 mL. LV SV: 91 mL.  ?- LV ED anteroseptal wall thickness (measured on short axis view): 1 cm.  ?- LV ED internal diameter (measured on short axis view): 6.2 cm.  ?- Aortic root: 3.5 cm.  ? ?INCIDENTAL FINDINGS:  ?- None.  ? ? ?IMPRESSION:  ? ?1.  Dilated left ventricle with marked hypokinesia and ejection fraction of 28%.  ?2.  Delayed mid-myocardial enhancement which can be seen with nonischemic dilated cardiomyopathy. Myocarditis and sarcoid would be additional considerations.   ? ? ?EKG:   EKG is personally reviewed.  ?04/21/2021: Sinus rhythm ? ? ?Recent Labs: ?01/15/2021: ALT 19; B Natriuretic Peptide 37.1; Hemoglobin 14.7; Magnesium 2.1; Platelets 219 ?01/18/2021: BUN 12; Creatinine, Ser 1.00; Potassium 4.2; Sodium 136   ? ?Recent Lipid Panel ?No results found for: CHOL, TRIG, HDL, CHOLHDL, VLDL, LDLCALC, LDLDIRECT ? ?Physical Exam:   ? ?VS:  There were no vitals taken for this visit.   ? ?Wt Readings from Last 3 Encounter

## 2021-04-21 NOTE — Patient Instructions (Addendum)
Medication Instructions:  ?Increase Entresto to 49-51 mg two times a day.  ?Your physician recommends that you continue on your current medications as directed. Please refer to the Current Medication list given to you today. ?*If you need a refill on your cardiac medications before your next appointment, please call your pharmacy* ? ?Lab Work: ?BMP, MAG needed in 2 weeks will schedule here please call and cancel if you get them at [facility].  ?If you have labs (blood work) drawn today and your tests are completely normal, you will receive your results only by: ?MyChart Message (if you have MyChart) OR ?A paper copy in the mail ?If you have any lab test that is abnormal or we need to change your treatment, we will call you to review the results. ? ?Testing/Procedures: ?None. ? ?Follow-Up: ?At [facility], you and your health needs are our priority.  As part of our continuing mission to provide you with exceptional heart care, we have created designated Provider Care Teams.  These Care Teams include your primary Cardiologist (physician) and Advanced Practice Providers (APPs -  Physician Assistants and Nurse Practitioners) who all work together to provide you with the care you need, when you need it. ? ?Your physician wants you to follow-up in: 12 months with Lars Mage, MD or one of the following Advanced Practice Providers on your designated Care Team:   ? ?Tommye Standard, PA-C ?Legrand Como "Jonni Sanger" Kenwood, PA-C ?  You will receive a reminder letter in the mail two months in advance. If you don't receive a letter, please call our office to schedule the follow-up appointment. ? ?Remote monitoring is used to monitor your ICD from home. This monitoring reduces the number of office visits required to check your device to one time per year. It allows [place] to keep an eye on the functioning of your device to ensure it is working properly. You are scheduled for a device check from home on 07/20/21. You may send your transmission at  any time that day. If you have a wireless device, the transmission will be sent automatically. After your physician reviews your transmission, you will receive a postcard with your next transmission date. ? ?We recommend signing up for the patient portal called "MyChart".  Sign up information is provided on this After Visit Summary.  MyChart is used to connect with patients for Virtual Visits (Telemedicine).  Patients are able to view lab/test results, encounter notes, upcoming appointments, etc.  Non-urgent messages can be sent to your provider as well.   ?To learn more about what you can do with MyChart, go to [email or web address].   ? ?Any Other Special Instructions Will Be Listed Below (If Applicable). ? ? ? ? ?  ? ? ?

## 2021-05-05 ENCOUNTER — Other Ambulatory Visit: Payer: BLUE CROSS/BLUE SHIELD | Admitting: *Deleted

## 2021-05-05 DIAGNOSIS — I428 Other cardiomyopathies: Secondary | ICD-10-CM

## 2021-05-05 DIAGNOSIS — Z95810 Presence of automatic (implantable) cardiac defibrillator: Secondary | ICD-10-CM

## 2021-05-05 DIAGNOSIS — Z79899 Other long term (current) drug therapy: Secondary | ICD-10-CM

## 2021-05-05 NOTE — Progress Notes (Signed)
Remote ICD transmission.   

## 2021-05-06 LAB — BASIC METABOLIC PANEL
BUN/Creatinine Ratio: 10 (ref 9–20)
BUN: 10 mg/dL (ref 6–20)
CO2: 24 mmol/L (ref 20–29)
Calcium: 9.1 mg/dL (ref 8.7–10.2)
Chloride: 101 mmol/L (ref 96–106)
Creatinine, Ser: 1.01 mg/dL (ref 0.76–1.27)
Glucose: 82 mg/dL (ref 70–99)
Potassium: 3.7 mmol/L (ref 3.5–5.2)
Sodium: 140 mmol/L (ref 134–144)
eGFR: 100 mL/min/{1.73_m2} (ref >59)

## 2021-05-06 LAB — MAGNESIUM: Magnesium: 2.1 mg/dL (ref 1.6–2.3)

## 2021-07-13 ENCOUNTER — Telehealth: Payer: Self-pay

## 2021-07-13 NOTE — Telephone Encounter (Signed)
Biotronik alert received for 2 tachy events occurred 07/12/21 @ 21:29 -21:33.  Rates were approx. 170 bpm. Spoke to patient and reports he was at the gym during this time and was asymptomatic. States he feels fine today.  Reset alert to recheck tomorrow 07/14/21. Patient advised if he were to have any symptoms such as shortness of breath, lightheadedness, palpitations, chest pain or other concerning symptoms to call. Patient voiced understanding and agreeable to plan. Continue to monitor.

## 2021-07-20 ENCOUNTER — Ambulatory Visit (INDEPENDENT_AMBULATORY_CARE_PROVIDER_SITE_OTHER): Payer: Self-pay

## 2021-07-20 DIAGNOSIS — I428 Other cardiomyopathies: Secondary | ICD-10-CM

## 2021-07-20 LAB — CUP PACEART REMOTE DEVICE CHECK
Battery Remaining Percentage: 100 %
Battery Voltage: 3.1 V
Brady Statistic AS VP Percent: 0 %
Brady Statistic AS VS Percent: 98 %
Brady Statistic RV Percent Paced: 0 %
Date Time Interrogation Session: 20230706010700
HighPow Impedance: 75 Ohm
HighPow Impedance: 77 Ohm
Implantable Lead Implant Date: 20230103
Implantable Lead Location: 753860
Implantable Lead Model: 436909
Implantable Lead Serial Number: 81418533
Implantable Pulse Generator Implant Date: 20230103
Lead Channel Impedance Value: 481 Ohm
Lead Channel Impedance Value: 502 Ohm
Lead Channel Pacing Threshold Amplitude: 0.8 V
Lead Channel Pacing Threshold Amplitude: 1 V
Lead Channel Pacing Threshold Pulse Width: 0.4 ms
Lead Channel Pacing Threshold Pulse Width: 0.4 ms
Lead Channel Sensing Intrinsic Amplitude: 9.5 mV
Lead Channel Setting Pacing Amplitude: 2 V
Lead Channel Setting Pacing Pulse Width: 0.4 ms
Lead Channel Setting Sensing Sensitivity: 0.8 mV
Pulse Gen Model: 429525
Pulse Gen Serial Number: 84879015

## 2021-08-08 NOTE — Progress Notes (Signed)
Remote ICD transmission.   

## 2021-08-11 ENCOUNTER — Telehealth: Payer: Self-pay

## 2021-08-11 NOTE — Telephone Encounter (Signed)
Biotronik alert received for tachycardia episode 08/10/21 @ 1:42pm. Patient called and reports he as at the gym working out and was asymptomatic. Advised patient we will continue to monitor and please call if any questions or concerns arise. Voiced understanding.

## 2021-09-26 ENCOUNTER — Emergency Department (HOSPITAL_COMMUNITY)
Admission: EM | Admit: 2021-09-26 | Discharge: 2021-09-26 | Disposition: A | Discharge status: Home / Self Care | Payer: BLUE CROSS/BLUE SHIELD | Attending: Emergency Medicine | Admitting: Emergency Medicine

## 2021-09-26 ENCOUNTER — Telehealth: Payer: Self-pay | Admitting: Cardiology

## 2021-09-26 ENCOUNTER — Encounter (HOSPITAL_COMMUNITY): Payer: Self-pay

## 2021-09-26 ENCOUNTER — Other Ambulatory Visit: Payer: Self-pay

## 2021-09-26 ENCOUNTER — Emergency Department (HOSPITAL_COMMUNITY): Payer: BLUE CROSS/BLUE SHIELD

## 2021-09-26 DIAGNOSIS — Z7982 Long term (current) use of aspirin: Secondary | ICD-10-CM | POA: Diagnosis not present

## 2021-09-26 DIAGNOSIS — R002 Palpitations: Secondary | ICD-10-CM | POA: Diagnosis present

## 2021-09-26 DIAGNOSIS — R001 Bradycardia, unspecified: Secondary | ICD-10-CM | POA: Insufficient documentation

## 2021-09-26 DIAGNOSIS — R0602 Shortness of breath: Secondary | ICD-10-CM | POA: Insufficient documentation

## 2021-09-26 DIAGNOSIS — Z79899 Other long term (current) drug therapy: Secondary | ICD-10-CM | POA: Diagnosis not present

## 2021-09-26 DIAGNOSIS — R079 Chest pain, unspecified: Secondary | ICD-10-CM | POA: Diagnosis not present

## 2021-09-26 LAB — MAGNESIUM: Magnesium: 2.1 mg/dL (ref 1.7–2.4)

## 2021-09-26 LAB — BASIC METABOLIC PANEL
Anion gap: 9 (ref 5–15)
BUN: 11 mg/dL (ref 6–20)
CO2: 25 mmol/L (ref 22–32)
Calcium: 9.6 mg/dL (ref 8.9–10.3)
Chloride: 105 mmol/L (ref 98–111)
Creatinine, Ser: 1.15 mg/dL (ref 0.61–1.24)
GFR, Estimated: >60 mL/min (ref >60)
Glucose, Bld: 80 mg/dL (ref 70–99)
Potassium: 3.8 mmol/L (ref 3.5–5.1)
Sodium: 139 mmol/L (ref 135–145)

## 2021-09-26 LAB — CBC
HCT: 45.2 % (ref 39.0–52.0)
Hemoglobin: 15.8 g/dL (ref 13.0–17.0)
MCH: 26.7 pg (ref 26.0–34.0)
MCHC: 35 g/dL (ref 30.0–36.0)
MCV: 76.4 fL — ABNORMAL LOW (ref 80.0–100.0)
Platelets: 238 10*3/uL (ref 150–400)
RBC: 5.92 MIL/uL — ABNORMAL HIGH (ref 4.22–5.81)
RDW: 13.8 % (ref 11.5–15.5)
WBC: 8.4 10*3/uL (ref 4.0–10.5)
nRBC: 0 % (ref 0.0–0.2)

## 2021-09-26 LAB — TROPONIN I (HIGH SENSITIVITY)
Troponin I (High Sensitivity): 21 ng/L — ABNORMAL HIGH (ref <18)
Troponin I (High Sensitivity): 20 ng/L — ABNORMAL HIGH (ref <18)

## 2021-09-26 LAB — BRAIN NATRIURETIC PEPTIDE: B Natriuretic Peptide: 18.7 pg/mL (ref 0.0–100.0)

## 2021-09-26 MED ORDER — SODIUM CHLORIDE 0.9 % IV BOLUS
1000.0000 mL | Freq: Once | INTRAVENOUS | Status: AC
  Administered 2021-09-26: 1000 mL via INTRAVENOUS

## 2021-09-26 NOTE — ED Notes (Signed)
Called lab to add on BNP and mag.

## 2021-09-26 NOTE — Discharge Instructions (Signed)
Your pacemaker did not show any PVCs or recent episodes of SVT.  Your other lab work is largely unremarkable.  Please follow-up with your cardiologist about your palpitations and your visit to the emergency department.  Be sure not to miss any further doses of your medications, especially your metoprolol.  Do not hesitate to return to the emergency department with any worsening or recurring symptoms.  It was a pleasure to meet you and we hope you feel better!

## 2021-09-26 NOTE — ED Triage Notes (Signed)
Patient bib [facility] from work. Pt had a defibrillator placed in January for Vtach. Per patient last night around 9 he felt SOB and chest discomfort. Pt went to work, began to have SOB  and chest discomfort. EMS arrived pts VSS. Within minutes pt began to be SOB 98% RA clammy. Per ems pt had runs of PVCs and Vtach.

## 2021-09-26 NOTE — Telephone Encounter (Signed)
EMS called and reports they were called out to patients work due to patient having chest pain and shortness of breath while in a meeting today. EMS was there and wanted clarification if patient should have pacing spikes on ECG. Advised patient has ICD but does have pacing component in device, although he paces 0%.   Spoke to patient on phone and patient confirmed chest pain onset today was sitting in a meeting along with shortness of breath. I verbally spoke to patient and advised that he go to the ER at [facility] for evaluation. Advised that is he was agreeable I would let the provider in the hospital know so they could be aware patient was coming. Patient voiced understanding and stated he was agreeable to go to [facility]. Renee in hospital made aware.

## 2021-09-26 NOTE — Telephone Encounter (Signed)
Caller stated she is with patient and needs advice.

## 2021-09-26 NOTE — ED Provider Notes (Signed)
[facility] EMERGENCY DEPARTMENT Provider Note   CSN: [id number] Arrival date & time: 09/26/21  1447     History  Chief Complaint  Patient presents with   Chest Pain   Shortness of Breath    Dakota Weber is a 36 y.o. male with a past medical history of systolic heart failure hyperlipidemia, sleep apnea and nonsustained V. tach presenting today due to chest palpitations and pressure.  Reports that it started last night while he was watching the football game.  He has a history of PVCs and says that he can feel them.  He went to sleep and then woke up this morning and they continued.  Said he got intermittently lightheaded but denies any symptoms at present.  Does endorse missing his medication, including metoprolol yesterday but has not missed any other doses.  Follows closely with cardiology.  Denies any shortness of breath or pain just describes it as "palpitations and discomfort."    Chest Pain Associated symptoms: palpitations   Associated symptoms: no back pain, no dizziness, no fever and no shortness of breath   Shortness of Breath Associated symptoms: no chest pain and no fever        Home Medications Prior to Admission medications   Medication Sig Start Date End Date Taking? Authorizing Provider  aspirin 81 MG EC tablet Take 81 mg by mouth daily. 10/11/20 10/11/21  [provider]  atorvastatin (LIPITOR) 20 MG tablet Take 20 mg by mouth at bedtime. 10/11/20   [provider]  empagliflozin (JARDIANCE) 10 MG TABS tablet Take 1 tablet (10 mg total) by mouth daily. 01/17/21   Arty Baumgartner, NP  metoprolol succinate (TOPROL-XL) 50 MG 24 hr tablet Take 50 mg by mouth daily. 03/27/21   [provider]  Multiple Vitamin (MULTIVITAMIN) capsule Take 1 capsule by mouth daily.    [provider]  sacubitril-valsartan (ENTRESTO) 49-51 MG Take 1 tablet by mouth 2 (two) times daily. 04/21/21   Lanier Prude, MD      Allergies     Ciprofloxacin    Review of Systems   Review of Systems  Constitutional:  Negative for chills and fever.  Respiratory:  Negative for shortness of breath.   Cardiovascular:  Positive for palpitations. Negative for chest pain and leg swelling.  Musculoskeletal:  Negative for back pain.  Neurological:  Positive for light-headedness. Negative for dizziness and syncope.    Physical Exam Updated Vital Signs BP (!) 160/101 (BP Location: Left Arm)   Pulse (!) 56   Temp 98.4 F (36.9 C) (Oral)   Resp 12   Ht 6\' 1"  (1.854 m)   Wt 106.6 kg   SpO2 99%   BMI 31.00 kg/m  Physical Exam Vitals and nursing note reviewed.  Constitutional:      General: He is not in acute distress.    Appearance: Normal appearance.  HENT:     Head: Normocephalic and atraumatic.  Eyes:     General: No scleral icterus.    Conjunctiva/sclera: Conjunctivae normal.  Cardiovascular:     Rate and Rhythm: Regular rhythm. Bradycardia present.  Pulmonary:     Effort: Pulmonary effort is normal. No respiratory distress.     Breath sounds: Normal breath sounds.  Chest:     Chest wall: No tenderness.     Comments: No reproducible chest wall tenderness Musculoskeletal:     Right lower leg: No edema.     Left lower leg: No edema.  Comments: Distal pulses equal  Skin:    General: Skin is warm and dry.     Findings: No rash.  Neurological:     Mental Status: He is alert.  Psychiatric:        Mood and Affect: Mood normal.    ED Results / Procedures / Treatments   Labs (all labs ordered are listed, but only abnormal results are displayed) Labs Reviewed  CBC - Abnormal; Notable for the following components:      Result Value   RBC 5.92 (*)    MCV 76.4 (*)    All other components within normal limits  TROPONIN I (HIGH SENSITIVITY) - Abnormal; Notable for the following components:   Troponin I (High Sensitivity) 21 (*)    All other components within normal limits  BASIC METABOLIC PANEL  BRAIN  NATRIURETIC PEPTIDE  MAGNESIUM  TROPONIN I (HIGH SENSITIVITY)    EKG None  Patient with an EKG normal sinus.  No PVCs  Radiology No results found.  Procedures Procedures   Medications Ordered in ED Medications - No data to display  ED Course/ Medical Decision Making/ A&P                           Medical Decision Making Amount and/or Complexity of Data Reviewed Labs: ordered. Radiology: ordered.   This is a 36 year old male with an extensive cardiac history presenting today with shortness of breath and chest pain.  ACS, PE, heart failure exacerbation, pneumothorax, pulmonary edema, PNA, arrhythmia.    This is not an exhaustive differential.    Past Medical History / Co-morbidities / Social History: HF, NSVT, AICD present   Additional history: Per chart review, patient follows with Dr. Lalla Brothers with cardiology.  He had placement of the Bio implant on 01/18/2021 after being admitted for cardiogenic syncope.  Patient had an echocardiogram in January which revealed an EF of 30 to 35%.  Pacemaker interrogation in January, April and July were all within normal limits.   Physical Exam: Pertinent physical exam findings include Unremarkable, no reproducible tenderness, no audible extrasystoles, no PVCs noted on bedside monitor  Lab Tests: I ordered, and personally interpreted labs.  The pertinent results include: Troponin #1 21, troponin #2 20 Normal BNP   Imaging Studies: I ordered and independently visualized and interpreted CXR and I agree with the radiologist that there are no acute findings   Cardiac Monitoring:  The patient was maintained on a cardiac monitor.  My attending physician Dr. Rubin Payor viewed and interpreted the cardiac monitored which showed an underlying rhythm of: Sinus   Medications: I ordered medication including fluids due to intermittent dizziness.  Patient reports feeling better   Consultations Obtained: Cardiology came to interrogate patient's  pacemaker.  There were no signs of PVCs or SVT for the past 48 hours.  MDM/Disposition: This is a 36 year old male who presented with palpitations and chest discomfort.  Has an extensive cardiac history.  AICD was interrogated without any acute findings.  Patient is asymptomatic.  He has a heart score of 3.  I considered admitting him to the hospital for further work but he also voices that he does not want to be admitted to the hospital.  He is stable, asymptomatic and I believe he stable for discharge and follow-up with his cardiology.  I spoke to my attending about this who is also in agreement.  PERC negative, doubt PE.  No signs of peripheral edema and no shortness  of breath.  Doubt heart failure exacerbation.  No indications for a dissection study.  Will be discharged for outpatient cardiology follow-up.     I discussed this case with my attending physician Dr. Rubin Payor who cosigned this note including patient's presenting symptoms, physical exam, and planned diagnostics and interventions. Attending physician stated agreement with plan or made changes to plan which were implemented.     Final Clinical Impression(s) / ED Diagnoses Final diagnoses:  Palpitations    Rx / DC Orders ED Discharge Orders     None      Results and diagnoses were explained to the patient. Return precautions discussed in full. Patient had no additional questions and expressed complete understanding.   This chart was dictated using voice recognition software.  Despite best efforts to proofread,  errors can occur which can change the documentation meaning.    Woodroe Chen 09/26/21 2114    Benjiman Core, MD 09/26/21 [phone]

## 2021-09-26 NOTE — ED Notes (Signed)
Called lab for update on BNP and mag. Lab states its in process now.

## 2021-10-09 ENCOUNTER — Telehealth: Payer: Self-pay

## 2021-10-09 NOTE — Telephone Encounter (Signed)
Received the following alert from [employer]:  Details for arrhythmia episode received (all types) Episode details were received for a spontaneous Slow nsT episode, which was detected on Oct 08, 2021 11:33:27 PM  (note: there is no duration recorded).   Patient has hx of nsT and VT. He also had a recent ER visit on 09/26/21 for palpitations with noted increase in PVC's.  He continues to take his metoprolol everyday.  While speaking with patient today do note the following:   Patient currently has symptoms suspicious of COVID - Fever, chills, sorethroat, cough and head congestion.  He is taking OTC Equate night time medicine which he started last night.  He read the ingredients:  Acetaminophen, Diphenhydramine and Dextromethoraphan.  He states this combo does not have any phenylephrine or psuedoephedrine formulation in it.  I strongly encouraged him to be tested for COVID and to contact his PCP.  I suggested he speak with his pharmacist for recommendations on safe alternatives for his symptoms or he could search for Coricidin HBP.  Patient states he was "very awake" at the 1133 PM hour last night.  He denies feeling any palpitations or other sx's other than dealing with his cold/flu like illness.    He states that his cardiologist prescribed spiranolactone for him to start.  He has not started due to concerns that it would have the same effect that the higher dose of Entresto that Dr. Quentin Ore prescribed gave him ie: sx  (dry, cracking lips).   Patient denies any other changes.  Will forward alert to Dr. Quentin Ore for any recommendations.  Patient is aware if any emergent sx develop to call 911 and go to ER.  He,also, was given our direct number in device clinic if any further questions or concerns.

## 2021-10-19 ENCOUNTER — Ambulatory Visit (INDEPENDENT_AMBULATORY_CARE_PROVIDER_SITE_OTHER): Payer: BLUE CROSS/BLUE SHIELD

## 2021-10-19 DIAGNOSIS — I428 Other cardiomyopathies: Secondary | ICD-10-CM | POA: Diagnosis not present

## 2021-10-20 LAB — CUP PACEART REMOTE DEVICE CHECK
Date Time Interrogation Session: 20231005155609
Implantable Lead Implant Date: 20230103
Implantable Lead Location: 753860
Implantable Lead Model: 436909
Implantable Lead Serial Number: 81418533
Implantable Pulse Generator Implant Date: 20230103
Pulse Gen Model: 429525
Pulse Gen Serial Number: 84879015

## 2021-10-26 ENCOUNTER — Emergency Department (EMERGENCY_DEPARTMENT_HOSPITAL)
Admission: EM | Admit: 2021-10-26 | Discharge: 2021-10-27 | Disposition: A | Discharge status: Other Acute Inpatient Hospital | Payer: BLUE CROSS/BLUE SHIELD | Source: Home / Self Care | Attending: Emergency Medicine | Admitting: Emergency Medicine

## 2021-10-26 ENCOUNTER — Encounter (HOSPITAL_COMMUNITY): Payer: Self-pay | Admitting: Emergency Medicine

## 2021-10-26 ENCOUNTER — Other Ambulatory Visit: Payer: Self-pay

## 2021-10-26 DIAGNOSIS — Z1152 Encounter for screening for COVID-19: Secondary | ICD-10-CM | POA: Insufficient documentation

## 2021-10-26 DIAGNOSIS — F339 Major depressive disorder, recurrent, unspecified: Secondary | ICD-10-CM

## 2021-10-26 DIAGNOSIS — R45851 Suicidal ideations: Secondary | ICD-10-CM

## 2021-10-26 DIAGNOSIS — Z79899 Other long term (current) drug therapy: Secondary | ICD-10-CM | POA: Insufficient documentation

## 2021-10-26 DIAGNOSIS — I509 Heart failure, unspecified: Secondary | ICD-10-CM | POA: Insufficient documentation

## 2021-10-26 DIAGNOSIS — F332 Major depressive disorder, recurrent severe without psychotic features: Secondary | ICD-10-CM | POA: Insufficient documentation

## 2021-10-26 HISTORY — DX: Heart failure, unspecified: I50.9

## 2021-10-26 HISTORY — DX: Ventricular tachycardia, unspecified: I47.20

## 2021-10-26 LAB — COMPREHENSIVE METABOLIC PANEL
ALT: 20 U/L (ref 0–44)
AST: 21 U/L (ref 15–41)
Albumin: 4.4 g/dL (ref 3.5–5.0)
Alkaline Phosphatase: 55 U/L (ref 38–126)
Anion gap: 7 (ref 5–15)
BUN: 12 mg/dL (ref 6–20)
CO2: 26 mmol/L (ref 22–32)
Calcium: 9.6 mg/dL (ref 8.9–10.3)
Chloride: 107 mmol/L (ref 98–111)
Creatinine, Ser: 1.21 mg/dL (ref 0.61–1.24)
GFR, Estimated: >60 mL/min (ref >60)
Glucose, Bld: 84 mg/dL (ref 70–99)
Potassium: 4.3 mmol/L (ref 3.5–5.1)
Sodium: 140 mmol/L (ref 135–145)
Total Bilirubin: 0.6 mg/dL (ref 0.3–1.2)
Total Protein: 7.7 g/dL (ref 6.5–8.1)

## 2021-10-26 LAB — CBC WITH DIFFERENTIAL/PLATELET
Abs Immature Granulocytes: 0.05 10*3/uL (ref 0.00–0.07)
Basophils Absolute: 0 10*3/uL (ref 0.0–0.1)
Basophils Relative: 0 %
Eosinophils Absolute: 0 10*3/uL (ref 0.0–0.5)
Eosinophils Relative: 0 %
HCT: 46.1 % (ref 39.0–52.0)
Hemoglobin: 15.6 g/dL (ref 13.0–17.0)
Immature Granulocytes: 1 %
Lymphocytes Relative: 14 %
Lymphs Abs: 1.4 10*3/uL (ref 0.7–4.0)
MCH: 26.2 pg (ref 26.0–34.0)
MCHC: 33.8 g/dL (ref 30.0–36.0)
MCV: 77.5 fL — ABNORMAL LOW (ref 80.0–100.0)
Monocytes Absolute: 0.8 10*3/uL (ref 0.1–1.0)
Monocytes Relative: 8 %
Neutro Abs: 7.5 10*3/uL (ref 1.7–7.7)
Neutrophils Relative %: 77 %
Platelets: 246 10*3/uL (ref 150–400)
RBC: 5.95 MIL/uL — ABNORMAL HIGH (ref 4.22–5.81)
RDW: 13.9 % (ref 11.5–15.5)
WBC: 9.8 10*3/uL (ref 4.0–10.5)
nRBC: 0 % (ref 0.0–0.2)

## 2021-10-26 LAB — RAPID URINE DRUG SCREEN, HOSP PERFORMED
Amphetamines: NOT DETECTED
Barbiturates: NOT DETECTED
Benzodiazepines: NOT DETECTED
Cocaine: NOT DETECTED
Opiates: NOT DETECTED
Tetrahydrocannabinol: NOT DETECTED

## 2021-10-26 LAB — RESP PANEL BY RT-PCR (FLU A&B, COVID) ARPGX2
Influenza A by PCR: NEGATIVE
Influenza B by PCR: NEGATIVE
SARS Coronavirus 2 by RT PCR: NEGATIVE

## 2021-10-26 LAB — ETHANOL: Alcohol, Ethyl (B): <10 mg/dL (ref <10)

## 2021-10-26 MED ORDER — ONDANSETRON HCL 4 MG PO TABS: 4.0000 mg | ORAL_TABLET | Freq: Three times a day (TID) | ORAL | Status: DC | PRN

## 2021-10-26 MED ORDER — METOPROLOL TARTRATE 25 MG PO TABS: 50.0000 mg | ORAL_TABLET | Freq: Two times a day (BID) | ORAL | Status: DC

## 2021-10-26 MED ORDER — EMPAGLIFLOZIN 10 MG PO TABS
10.0000 mg | ORAL_TABLET | Freq: Every day | ORAL | Status: DC
  Administered 2021-10-27: 10 mg via ORAL
  Filled 2021-10-26: qty 1

## 2021-10-26 MED ORDER — SACUBITRIL-VALSARTAN 24-26 MG PO TABS
1.0000 | ORAL_TABLET | Freq: Two times a day (BID) | ORAL | Status: DC
  Filled 2021-10-26: qty 1

## 2021-10-26 MED ORDER — SACUBITRIL-VALSARTAN 24-26 MG PO TABS
1.0000 | ORAL_TABLET | Freq: Two times a day (BID) | ORAL | Status: DC
  Administered 2021-10-26: 1 via ORAL

## 2021-10-26 MED ORDER — SACUBITRIL-VALSARTAN 24-26 MG PO TABS
1.0000 | ORAL_TABLET | Freq: Two times a day (BID) | ORAL | Status: DC
  Administered 2021-10-27: 1 via ORAL
  Filled 2021-10-26 (×2): qty 1

## 2021-10-26 MED ORDER — ALUM & MAG HYDROXIDE-SIMETH 200-200-20 MG/5ML PO SUSP: 30.0000 mL | Freq: Four times a day (QID) | ORAL | Status: DC | PRN

## 2021-10-26 MED ORDER — ZOLPIDEM TARTRATE 5 MG PO TABS: 5.0000 mg | ORAL_TABLET | Freq: Every evening | ORAL | Status: DC | PRN

## 2021-10-26 MED ORDER — METOPROLOL SUCCINATE ER 25 MG PO TB24
50.0000 mg | ORAL_TABLET | Freq: Every day | ORAL | Status: DC
  Administered 2021-10-27: 50 mg via ORAL
  Filled 2021-10-26: qty 2

## 2021-10-26 MED ORDER — ACETAMINOPHEN 325 MG PO TABS: 650.0000 mg | ORAL_TABLET | ORAL | Status: DC | PRN

## 2021-10-26 MED ORDER — METOPROLOL SUCCINATE ER 25 MG PO TB24: 50.0000 mg | ORAL_TABLET | Freq: Every day | ORAL | Status: DC

## 2021-10-26 MED ORDER — ATORVASTATIN CALCIUM 10 MG PO TABS
20.0000 mg | ORAL_TABLET | Freq: Every day | ORAL | Status: DC
  Administered 2021-10-26 – 2021-10-27 (×2): 20 mg via ORAL
  Filled 2021-10-26 (×2): qty 2

## 2021-10-26 NOTE — ED Provider Notes (Signed)
[facility] EMERGENCY DEPARTMENT Provider Note   CSN: [id number] Arrival date & time: 10/26/21  1316     History  Chief Complaint  Patient presents with   Suicidal    Dakota Weber is a 36 y.o. male.  The history is provided by the patient and medical records. No language interpreter was used.     36 year old male with significant history of CHF and nonischemic cardiomyopathy brought here via EMS from home home with suicidal ideation.  Patient reports he is experiencing many stressors in his life that culminated into having suicidal thoughts this morning thus causing him to reach out to the suicide hotline and was brought here for further assessment.  He reported he does not have any specific suicidal plan and denies homicidal ideation, auditory or visual hallucination.  He did not go into detail his specific stressor.  He denies self-medicating with drugs or alcohol.  He is currently not on any psychiatric medication.  He is here voluntarily.  Home Medications Prior to Admission medications   Medication Sig Start Date End Date Taking? Authorizing Provider  atorvastatin (LIPITOR) 20 MG tablet Take 20 mg by mouth at bedtime. 10/11/20   [provider]  empagliflozin (JARDIANCE) 10 MG TABS tablet Take 1 tablet (10 mg total) by mouth daily. 01/17/21   Cheryln Manly, NP  metoprolol succinate (TOPROL-XL) 50 MG 24 hr tablet Take 50 mg by mouth daily. 03/27/21   [provider]  Multiple Vitamin (MULTIVITAMIN) capsule Take 1 capsule by mouth daily.    [provider]  sacubitril-valsartan (ENTRESTO) 24-26 MG Take 1 tablet by mouth 2 (two) times daily. Please hold entresto if systolic blood pressure less than 100. 08/17/21   [provider]  sacubitril-valsartan (ENTRESTO) 49-51 MG Take 1 tablet by mouth 2 (two) times daily. Patient not taking: Reported on 09/26/2021 04/21/21   Vickie Epley, MD      Allergies    Ciprofloxacin     Review of Systems   Review of Systems  All other systems reviewed and are negative.   Physical Exam Updated Vital Signs BP 137/89 (BP Location: Right Arm)   Pulse 62   Temp 98.5 F (36.9 C) (Oral)   Resp 18   SpO2 98%  Physical Exam Vitals and nursing note reviewed.  Constitutional:      General: He is not in acute distress.    Appearance: He is well-developed.  HENT:     Head: Atraumatic.  Eyes:     Conjunctiva/sclera: Conjunctivae normal.  Musculoskeletal:     Cervical back: Neck supple.  Skin:    Findings: No rash.  Neurological:     Mental Status: He is alert.  Psychiatric:        Attention and Perception: Attention normal.        Mood and Affect: Mood is depressed.        Speech: Speech normal.        Behavior: Behavior is cooperative.        Thought Content: Thought content is not paranoid. Thought content includes suicidal ideation. Thought content does not include homicidal ideation.     ED Results / Procedures / Treatments   Labs (all labs ordered are listed, but only abnormal results are displayed) Labs Reviewed  CBC WITH DIFFERENTIAL/PLATELET - Abnormal; Notable for the following components:      Result Value   RBC 5.95 (*)    MCV 77.5 (*)    All other components within  normal limits  RESP PANEL BY RT-PCR (FLU A&B, COVID) ARPGX2  COMPREHENSIVE METABOLIC PANEL  ETHANOL  RAPID URINE DRUG SCREEN, HOSP PERFORMED    EKG None  Radiology No results found.  Procedures Procedures    Medications Ordered in ED Medications  acetaminophen (TYLENOL) tablet 650 mg (has no administration in time range)  zolpidem (AMBIEN) tablet 5 mg (has no administration in time range)  ondansetron (ZOFRAN) tablet 4 mg (has no administration in time range)  alum & mag hydroxide-simeth (MAALOX/MYLANTA) 200-200-20 MG/5ML suspension 30 mL (has no administration in time range)  empagliflozin (JARDIANCE) tablet 10 mg (has no administration in time range)  metoprolol  succinate (TOPROL-XL) 24 hr tablet 50 mg (has no administration in time range)  sacubitril-valsartan (ENTRESTO) 24-26 mg per tablet (has no administration in time range)    ED Course/ Medical Decision Making/ A&P                           Medical Decision Making Risk OTC drugs. Prescription drug management.   BP 137/89 (BP Location: Right Arm)   Pulse 62   Temp 98.5 F (36.9 C) (Oral)   Resp 18   SpO2 98%   3:18 PM 36 year old male with significant history of CHF and nonischemic cardiomyopathy brought here via EMS from home home with suicidal ideation.  Patient reports he is experiencing many stressors in his life that culminated into having suicidal thoughts this morning thus causing him to reach out to the suicide hotline and was brought here for further assessment.  He reported he does not have any specific suicidal plan and denies homicidal ideation, auditory or visual hallucination.  He did not go into detail his specific stressor.  He denies self-medicating with drugs or alcohol.  He is currently not on any psychiatric medication.  He is here voluntarily.  On exam, patient is laying in bed appears to be in no acute discomfort.  He is calm and cooperative.  His speech is clear.  He does not respond to internal stimuli.  He has normal stable vital sign.  Labs obtained and independently viewed and interpreted by me.  Negative UDS, electrolyte panels are reassuring, normal WBC, normal H&H, COVID test negative.  At this time, patient is willing to be care further by psychiatry team.  IVC paperwork was not filed at this time.  Patient is medically cleared.  This patient presents to the ED for concern of suicidal ideation, this involves an extensive number of treatment options, and is a complaint that carries with it a high risk of complications and morbidity.  The differential diagnosis includes depression, metabolic derangement, psychosis  Co morbidities that complicate the patient  evaluation heart disease Additional history obtained:  Additional history obtained from EMS External records from outside source obtained and reviewed including EMR  Lab Tests:  I Ordered, and personally interpreted labs.  The pertinent results include:  as above  Cardiac Monitoring:  The patient was maintained on a cardiac monitor.  I personally viewed and interpreted the cardiac monitored which showed an underlying rhythm of: NSR  Medicines ordered and prescription drug management:  I ordered medication including PRN meds  for psych hold Reevaluation of the patient after these medicines showed that the patient stayed the same I have reviewed the patients home medicines and have made adjustments as needed  Test Considered: as above  Critical Interventions: TTS  Consultations Obtained:  I requested consultation with the TTS,  and discussed lab and imaging findings as well as pertinent plan - they recommend: pending  Problem List / ED Course: suicidal ideation  Reevaluation:  After the interventions noted above, I reevaluated the patient and found that they have :stayed the same  Social Determinants of Health: depression  Dispostion:  After consideration of the diagnostic results and the patients response to treatment, I feel that the patent would benefit from psych admission.         Final Clinical Impression(s) / ED Diagnoses Final diagnoses:  Suicidal ideation    Rx / DC Orders ED Discharge Orders     None         Fayrene Helper, PA-C 10/26/21 2314    Charlynne Pander, MD 10/26/21 2326

## 2021-10-26 NOTE — ED Provider Triage Note (Signed)
Emergency Medicine Provider Triage Evaluation Note  Dakota Weber , a 36 y.o. male  was evaluated in triage.  Pt complains of suicidal ideation without planning or intent began earlier this morning.  Called the suicide hotline earlier today, states he has been feeling "close" with worsening stressors.  Hx of prior SI a few times in the past, however denies planning or intent at those times either.  No other complaints at time.  Review of Systems  Positive:  Negative: See above  Physical Exam  BP 137/89 (BP Location: Right Arm)   Pulse 62   Temp 98.5 F (36.9 C) (Oral)   Resp 18   SpO2 98%  Gen:   Awake, no distress   Resp:  Normal effort  MSK:   Moves extremities without difficulty  Other:  Sitting comfortably, overall flat affect, endorses SI, denies HI/AVH  Medical Decision Making  Medically screening exam initiated at 1:56 PM.  Appropriate orders placed.  Dakota Weber was informed that the remainder of the evaluation will be completed by another provider, this initial triage assessment does not replace that evaluation, and the importance of remaining in the ED until their evaluation is complete.     Prince Rome, PA-C 02/63/78 1401

## 2021-10-26 NOTE — ED Notes (Signed)
Patient provided food and drink. Patient acknowledges plan of care and process for assessment. Patient cooperative and calm at this time. Denies plan for self harm but endorses wish to fall asleep and not wake up.

## 2021-10-26 NOTE — ED Notes (Signed)
Pt changing into scrubs now.  

## 2021-10-26 NOTE — Progress Notes (Signed)
Remote ICD transmission.   

## 2021-10-26 NOTE — ED Notes (Signed)
Pts belongings placed in locker number 5 Called to be wanded

## 2021-10-26 NOTE — ED Notes (Signed)
Pt. Refused to give urine sample

## 2021-10-26 NOTE — BH Assessment (Addendum)
Comprehensive Clinical Assessment (CCA) Note  10/27/2021 Dakota Weber [id number] Disposition: Clinician discussed patient care with Dakota Guadeloupe, NP.  He is recommending inpatient care for patient.  Clinician informed RN Dakota Weber of the disposition via secure messaging. Flowsheet Row ED from 10/26/2021 in [facility] EMERGENCY DEPARTMENT ED from 09/26/2021 in [facility] EMERGENCY DEPARTMENT ED to Hosp-Admission (Discharged) from 01/15/2021 in [facility]  C-SSRS RISK CATEGORY High Risk No Risk No Risk      The patient demonstrates the following risk factors for suicide: Chronic risk factors for suicide include: psychiatric disorder of MDD and previous suicide attempts x 2 . Acute risk factors for suicide include: social withdrawal/isolation and loss (financial, interpersonal, professional). Protective factors for this patient include:  no identified protective factors . Considering these factors, the overall suicide risk at this point appears to be high. Patient is not appropriate for outpatient follow up.   Patient has a flat affect although he has good eye contact.  He is oriented x4.  Pt is tearful at times during assessment.  Pt is not responding to internal stimuli.  He does not evidence any delusional though processes.  He expresses himself in a clear, goal directed manner.  He reports his appetite as being more and always has.  Patient sleep is normal, he does use a c-pap.  Pt has no current outpatient care.     Chief Complaint:  Chief Complaint  Patient presents with   Suicidal   Visit Diagnosis: MDD recurrent, severe    CCA Screening, Triage and Referral (STR)  Patient Reported Information How did you hear about [place]? Other (Comment) (Pt called EMS due to suicidal thoughts.)  What Is the Reason for Your Visit/Call Today? Pt had contacted a suicide hotline today and after talking to them called EMS to get to [facility].  Stressors  lately include his car getting re-possessed; health issues (cardiomyopathy).  He is worried about finances.  His suicidal thoughts got worse today after his car got repossessed.  Patient has no particular plan but says "if I coudl go to sleep and not wake up I would feel fine with that."  Pt has had previous attempts (2) with the last one being 2 years ago.  Patient denies any HI or A/V hallucinations.  He does not use ETOH or other substances.  Patient denies any access to weapons.  Pt says his appetite is too much.  He uses a c-pap and sleeps well.  Pt does not have any outpatient care.  How Long Has This Been Causing You Problems? <Week  What Do You Feel Would Help You the Most Today? Treatment for Depression or other mood problem   Have You Recently Had Any Thoughts About Hurting Yourself? Yes  Are You Planning to Commit Suicide/Harm Yourself At This time? No   Have you Recently Had Thoughts About Hurting Someone Dakota Weber? No  Are You Planning to Harm Someone at This Time? No  Explanation: No data recorded  Have You Used Any Alcohol or Drugs in the Past 24 Hours? No  How Long Ago Did You Use Drugs or Alcohol? No data recorded What Did You Use and How Much? No data recorded  Do You Currently Have a Therapist/Psychiatrist? No  Name of Therapist/Psychiatrist: No data recorded  Have You Been Recently Discharged From Any Office Practice or Programs? No  Explanation of Discharge From Practice/Program: No data recorded    CCA Screening Triage Referral Assessment Type of  Contact: Tele-Assessment  Telemedicine Service Delivery:   Is this Initial or Reassessment? Initial Assessment  Date Telepsych consult ordered in [facility]:  10/26/21  Time Telepsych consult ordered in [facility]:  2049  Location of Assessment: [facility] ED  Provider Location: [facility] Assessment Services   Collateral Involvement: No data recorded  Does Patient Have a Court Appointed Legal Guardian? No  Legal Guardian Contact  Information: No data recorded Copy of Legal Guardianship Form: No data recorded Legal Guardian Notified of Arrival: No data recorded Legal Guardian Notified of Pending Discharge: No data recorded If Minor and Not Living with Parent(s), Who has Custody? No data recorded Is CPS involved or ever been involved? No data recorded Is APS involved or ever been involved? Never   Patient Determined To Be At Risk for Harm To Self or Others Based on Review of Patient Reported Information or Presenting Complaint? Yes, for Self-Harm  Method: No data recorded Availability of Means: No data recorded Intent: No data recorded Notification Required: No data recorded Additional Information for Danger to Others Potential: No data recorded Additional Comments for Danger to Others Potential: No data recorded Are There Guns or Other Weapons in Your Home? No data recorded Types of Guns/Weapons: No data recorded Are These Weapons Safely Secured?                            No data recorded Who Could Verify You Are Able To Have These Secured: No data recorded Do You Have any Outstanding Charges, Pending Court Dates, Parole/Probation? No data recorded Contacted To Inform of Risk of Harm To Self or Others: No data recorded   Does Patient Present under Involuntary Commitment? No  IVC Papers Initial File Date: No data recorded  [place] of Residence: [place]   Patient Currently Receiving the Following Services: Not Receiving Services   Determination of Need: Urgent (48 hours)   Options For Referral: Inpatient Hospitalization     CCA Biopsychosocial Patient Reported Schizophrenia/Schizoaffective Diagnosis in Past: No   Strengths: Pt loves to fish.  He likes technology and   Mental Health Symptoms Depression:   Change in energy/activity; Fatigue; Hopelessness; Increase/decrease in appetite; Tearfulness   Duration of Depressive symptoms:  Duration of Depressive Symptoms: Greater than two weeks    Mania:   None   Anxiety:    Difficulty concentrating; Tension; Worrying   Psychosis:   None   Duration of Psychotic symptoms:    Trauma:   None   Obsessions:   None   Compulsions:   None   Inattention:   N/A   Hyperactivity/Impulsivity:   N/A   Oppositional/Defiant Behaviors:   N/A   Emotional Irregularity:   Chronic feelings of emptiness   Other Mood/Personality Symptoms:  No data recorded   Mental Status Exam Appearance and self-care  Stature:   Average   Weight:   Average weight   Clothing:   Casual   Grooming:   Normal   Cosmetic use:   None   Posture/gait:   Normal   Motor activity:   Not Remarkable   Sensorium  Attention:   Normal   Concentration:   Anxiety interferes   Orientation:  No data recorded  Recall/memory:   Defective in Recent   Affect and Mood  Affect:   Depressed; Flat; Blunted   Mood:   Depressed   Relating  Eye contact:   Normal   Facial expression:   Depressed; Sad   Attitude  toward examiner:   Cooperative   Thought and Language  Speech flow:  Clear and Coherent   Thought content:   Appropriate to Mood and Circumstances   Preoccupation:   None   Hallucinations:   None   Organization:  No data recorded  [employer] of Knowledge:   Average   Intelligence:   Average   Abstraction:   Normal   Judgement:   Normal   Reality Testing:   Realistic   Insight:   Good   Decision Making:   Normal   Social Functioning  Social Maturity:   Responsible   Social Judgement:   Normal   Stress  Stressors:   Relationship; Work; Brewing technologist; Financial   Coping Ability:   Overwhelmed   Skill Deficits:   None   Supports:   Support needed     Religion: Religion/Spirituality Are You A Religious Person?: Yes What is Your Religious Affiliation?: International aid/development worker: Leisure / Recreation Do You Have Hobbies?: Yes Leisure and Hobbies:  Insurance underwriter  Exercise/Diet: Exercise/Diet Do You Exercise?: Yes What Type of Exercise Do You Do?: Weight Training How Many Times a Week Do You Exercise?: 1-3 times a week Have You Gained or Lost A Significant Amount of Weight in the Past Six Months?: No Do You Follow a Special Diet?: No Do You Have Any Trouble Sleeping?: No   CCA Employment/Education Employment/Work Situation: Employment / Work Situation Employment Situation: Employed 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 Patient's Job has Been Impacted by Current Illness: No Has Patient ever Been in the [employer]?: No  Education: Education Is Patient Currently Attending School?: No Did You Nutritional therapist?: Yes What Type of College Degree Do you Have?: BS in Communication at [employer] Did You Have An Individualized Education Program (IIEP): No Did You Have Any Difficulty At School?: No Patient's Education Has Been Impacted by Current Illness: No   CCA Family/Childhood History Family and Relationship History: Family history Marital status: Divorced What types of issues is patient dealing with in the relationship?: Trust, frequent conflicts, past infidelity Does patient have children?: No  Childhood History:  Childhood History By whom was/is the patient raised?: Both parents Did patient suffer any verbal/emotional/physical/sexual abuse as a child?: No Has patient ever been sexually abused/assaulted/raped as an adolescent or adult?: No Witnessed domestic violence?: No Has patient been affected by domestic violence as an adult?: No  Child/Adolescent Assessment:     CCA Substance Use Alcohol/Drug Use: Alcohol / Drug Use Pain Medications: See MAR Prescriptions: See MAR Over the Counter: ASA History of alcohol / drug use?: No history of alcohol / drug abuse Longest period of sobriety (when/how long): NA                         ASAM's:  Six Dimensions of Multidimensional  Assessment  Dimension 1:  Acute Intoxication and/or Withdrawal Potential:      Dimension 2:  Biomedical Conditions and Complications:      Dimension 3:  Emotional, Behavioral, or Cognitive Conditions and Complications:     Dimension 4:  Readiness to Change:     Dimension 5:  Relapse, Continued use, or Continued Problem Potential:     Dimension 6:  Recovery/Living Environment:     ASAM Severity Score:    ASAM Recommended Level of Treatment:     Substance use Disorder (SUD)    Recommendations for Services/Supports/Treatments:    Discharge Disposition:    DSM5 Diagnoses:  Patient Active Problem List   Diagnosis Date Noted   Hyperlipidemia 01/16/2021   Syncope 01/15/2021   NICM (nonischemic cardiomyopathy) (HCC) 01/15/2021   NSVT (nonsustained ventricular tachycardia) (HCC) 01/15/2021     Referrals to Alternative Service(s): Referred to Alternative Service(s):   Place:   Date:   Time:    Referred to Alternative Service(s):   Place:   Date:   Time:    Referred to Alternative Service(s):   Place:   Date:   Time:    Referred to Alternative Service(s):   Place:   Date:   Time:     Wandra Mannan

## 2021-10-26 NOTE — ED Triage Notes (Signed)
Pt arrives via EMS from home. Pt called the SI hotline- no plans to harm himself yet but feeling like he is close. Pt complains of a lot of stressors in his life. HR 65, BP 148/90, 100% on room air

## 2021-10-26 NOTE — ED Notes (Signed)
TTS in process 

## 2021-10-27 ENCOUNTER — Encounter (HOSPITAL_COMMUNITY): Payer: Self-pay | Admitting: Psychiatry

## 2021-10-27 ENCOUNTER — Inpatient Hospital Stay (HOSPITAL_COMMUNITY)
Admission: AD | Admit: 2021-10-27 | Discharge: 2021-10-30 | DRG: 885 | Disposition: A | Discharge status: Home / Self Care | Payer: BLUE CROSS/BLUE SHIELD | Source: Intra-hospital | Attending: Psychiatry | Admitting: Psychiatry

## 2021-10-27 ENCOUNTER — Other Ambulatory Visit: Payer: Self-pay

## 2021-10-27 DIAGNOSIS — E7800 Pure hypercholesterolemia, unspecified: Secondary | ICD-10-CM | POA: Diagnosis present

## 2021-10-27 DIAGNOSIS — Z1152 Encounter for screening for COVID-19: Secondary | ICD-10-CM

## 2021-10-27 DIAGNOSIS — I5022 Chronic systolic (congestive) heart failure: Secondary | ICD-10-CM | POA: Diagnosis present

## 2021-10-27 DIAGNOSIS — Z89022 Acquired absence of left finger(s): Secondary | ICD-10-CM | POA: Diagnosis not present

## 2021-10-27 DIAGNOSIS — Z818 Family history of other mental and behavioral disorders: Secondary | ICD-10-CM

## 2021-10-27 DIAGNOSIS — R7303 Prediabetes: Secondary | ICD-10-CM | POA: Diagnosis present

## 2021-10-27 DIAGNOSIS — I422 Other hypertrophic cardiomyopathy: Secondary | ICD-10-CM | POA: Diagnosis present

## 2021-10-27 DIAGNOSIS — E785 Hyperlipidemia, unspecified: Secondary | ICD-10-CM | POA: Diagnosis present

## 2021-10-27 DIAGNOSIS — Z79899 Other long term (current) drug therapy: Secondary | ICD-10-CM | POA: Diagnosis not present

## 2021-10-27 DIAGNOSIS — I110 Hypertensive heart disease with heart failure: Secondary | ICD-10-CM | POA: Diagnosis present

## 2021-10-27 DIAGNOSIS — Z7982 Long term (current) use of aspirin: Secondary | ICD-10-CM

## 2021-10-27 DIAGNOSIS — R45851 Suicidal ideations: Secondary | ICD-10-CM | POA: Diagnosis present

## 2021-10-27 DIAGNOSIS — G4730 Sleep apnea, unspecified: Secondary | ICD-10-CM | POA: Diagnosis present

## 2021-10-27 DIAGNOSIS — I428 Other cardiomyopathies: Secondary | ICD-10-CM

## 2021-10-27 DIAGNOSIS — Z87820 Personal history of traumatic brain injury: Secondary | ICD-10-CM

## 2021-10-27 DIAGNOSIS — Z62811 Personal history of psychological abuse in childhood: Secondary | ICD-10-CM

## 2021-10-27 DIAGNOSIS — G4700 Insomnia, unspecified: Secondary | ICD-10-CM | POA: Diagnosis present

## 2021-10-27 DIAGNOSIS — F332 Major depressive disorder, recurrent severe without psychotic features: Principal | ICD-10-CM | POA: Diagnosis present

## 2021-10-27 DIAGNOSIS — Z881 Allergy status to other antibiotic agents status: Secondary | ICD-10-CM | POA: Diagnosis not present

## 2021-10-27 DIAGNOSIS — F339 Major depressive disorder, recurrent, unspecified: Secondary | ICD-10-CM

## 2021-10-27 DIAGNOSIS — Z7984 Long term (current) use of oral hypoglycemic drugs: Secondary | ICD-10-CM | POA: Diagnosis not present

## 2021-10-27 DIAGNOSIS — G4733 Obstructive sleep apnea (adult) (pediatric): Secondary | ICD-10-CM | POA: Insufficient documentation

## 2021-10-27 DIAGNOSIS — Z95810 Presence of automatic (implantable) cardiac defibrillator: Secondary | ICD-10-CM | POA: Diagnosis not present

## 2021-10-27 MED ORDER — EMPAGLIFLOZIN 10 MG PO TABS
10.0000 mg | ORAL_TABLET | Freq: Every day | ORAL | Status: DC
  Administered 2021-10-28 – 2021-10-30 (×3): 10 mg via ORAL
  Filled 2021-10-27 (×5): qty 1

## 2021-10-27 MED ORDER — ALUM & MAG HYDROXIDE-SIMETH 200-200-20 MG/5ML PO SUSP: 30.0000 mL | Freq: Four times a day (QID) | ORAL | Status: DC | PRN

## 2021-10-27 MED ORDER — ZOLPIDEM TARTRATE 5 MG PO TABS: 5.0000 mg | ORAL_TABLET | Freq: Every evening | ORAL | Status: DC | PRN

## 2021-10-27 MED ORDER — METOPROLOL SUCCINATE ER 50 MG PO TB24
50.0000 mg | ORAL_TABLET | Freq: Every day | ORAL | Status: DC
  Administered 2021-10-28 – 2021-10-30 (×3): 50 mg via ORAL
  Filled 2021-10-27 (×5): qty 1

## 2021-10-27 MED ORDER — ATORVASTATIN CALCIUM 20 MG PO TABS
20.0000 mg | ORAL_TABLET | Freq: Every day | ORAL | Status: DC
  Administered 2021-10-28 – 2021-10-30 (×3): 20 mg via ORAL
  Filled 2021-10-27 (×5): qty 1

## 2021-10-27 MED ORDER — ACETAMINOPHEN 325 MG PO TABS: 650.0000 mg | ORAL_TABLET | ORAL | Status: DC | PRN

## 2021-10-27 MED ORDER — ONDANSETRON HCL 4 MG PO TABS: 4.0000 mg | ORAL_TABLET | Freq: Three times a day (TID) | ORAL | Status: DC | PRN

## 2021-10-27 MED ORDER — SACUBITRIL-VALSARTAN 24-26 MG PO TABS
1.0000 | ORAL_TABLET | Freq: Two times a day (BID) | ORAL | Status: DC
  Administered 2021-10-27 – 2021-10-30 (×6): 1 via ORAL
  Filled 2021-10-27 (×11): qty 1

## 2021-10-27 NOTE — Progress Notes (Signed)
Adult Psychoeducational Group Note  Date:  10/27/2021 Time:  9:37 PM  Group Topic/Focus:  AA Group  Participation Level:  Active  Participation Quality:  Attentive  Affect:  Appropriate  Cognitive:  Appropriate  Insight: Appropriate  Engagement in Group:  Engaged  Modes of Intervention:  Discussion and Support  Additional Comments:    Pt attended and was attentive during the [place] group.  Dakota Weber 10/27/2021, 9:37 PM

## 2021-10-27 NOTE — Progress Notes (Addendum)
Pt was accepted to [facility] 10/27/21; Bed Assignment 401-1.  If voluntary can we have the consent signed and faxed to [phone]  Pt meets inpatient criteria per Merlyn Lot, NP  Attending Physician will be Dr. Janine Limbo  Report can be called to: Adult unit: [phone]  Pt can arrive after: [place] and nursing will coordinate  Care Team notified: [facility] Lynnda Shields, RN, Merlyn Lot, NP, Adalberto Ill RN,   Nadara Mode, LCSWA 10/27/2021 @ 12:37 PM

## 2021-10-27 NOTE — ED Provider Notes (Signed)
Patient excepted for admission to [facility] behavioral health.  Patient is not IVC.   Fredia Sorrow, MD 10/27/21 [phone]

## 2021-10-27 NOTE — ED Notes (Signed)
TTS done 

## 2021-10-27 NOTE — ED Provider Notes (Signed)
Emergency Medicine Observation Re-evaluation Note  Ebrahim Deremer is a 36 y.o. male, seen on rounds today.  Pt initially presented to the ED for complaints of Suicidal Currently, the patient is awake ambulating in his room in no acute distress with no complaints.  Physical Exam  BP 113/77 (BP Location: Right Arm)   Pulse 72   Temp 98.4 F (36.9 C) (Oral)   Resp 16   SpO2 96%  Physical Exam General: Awake and alert no acute distress Cardiac: Regular rate Lungs: No increased work of breathing Psych: Calm and cooperative  ED Course / MDM  EKG:   I have reviewed the labs performed to date as well as medications administered while in observation.  Recent changes in the last 24 hours include recommended inpatient placement, pending acceptance.  Plan  Current plan is for inpatient placement.    Kemper Durie, DO 10/27/21 [phone]

## 2021-10-27 NOTE — Progress Notes (Signed)
Pt admitted to [facility] from [facility] where he presented initially after calling suicide hotline with SI without plan. Per chart review and assessment pt confirms his triggers being multiple life stressors such as finances "I'm under paid at my job, my car being repossessed, I live with my mom and don't really like it at this time in my life. If I go to sleep and don't wake up I will be fine with that". A & O X4 with fair eye contact, logical speech. Pt currently endorses passive SI without plan. However, he denies HI, AVH and pain when assessed. Skin assessed, scar noted to left chest wall "I had a defibrillator put in there earlier this year" and scar to inner aspect of his left leg "My dog bate me there". Belongings searched, items deemed contraband secured in locker. Pt denies substance abuse "I last drank the last weekend in September. When I do drink, I do it responsibly, usually 3 drinks, brown liquor. I don't smoke". He also denies family history of substance abuse and suicide. Reports a cousin diagnosed with bipolar and schizophrenia. Ambulatory to unit with a steady gait. Safety checks initiated at Q 15 minutes intervals without incident thus far. Unit orientation done, routines discussed, care plan reviewed with pt, admission documents signed; pt verbalized understanding. Emotional support, encouragement and reassurance offered to pt. Sandwich tray and fluids given, tolerated well. Pt denies concerns at this time.

## 2021-10-27 NOTE — Consult Note (Cosign Needed Addendum)
Telepsych Consultation   Reason for Consult:  Psychiatric Reassessment for Suicidal Ideations Referring Physician:  Domenic Moras, PA-C Location of Patient:    [facility] Location of Provider: Other: virtual home office  Patient Identification: [facility] MRN:  [id number] Principal Diagnosis: Major depressive disorder, recurrent episode ([place]) Diagnosis:  Principal Problem:   Major depressive disorder, recurrent episode ([place])   Total Time spent with patient: 30 minutes  Subjective:   [facility] is a 36 y.o. male patient admitted with suicidal ideations.  HPI:  Patient seen via telepsych by this provider; chart reviewed and consulted with Dr. Dwyane Dee on 10/27/21.  On evaluation [facility] reports he is not doing good today.   Patient continues to endorse suicidal ideations, is guarded and does not disclose plan but within the past year has planned to jump from an undisclosed [place] or 3rd story apartment.  Pt  is triggered by mounting psychosocial stressors and limited coping skills.  He endorses feelings of hopelessness, despair, no social support and cannot verbalize protective factors against self harm.  Pt cannot contract for safety.   Pt reports he feels like he's being locked up in the hospital and feels he's being forced to remain in his room.    Given this, he would benefit from inpatient admission where he can be monitored for safety and mood stabilized.  Pt is not taking psychiatric medications and is very hesitant about starting them today. Anticipatory guidance provided regarding treatment plan, role of psychiatric medications to promote mood stability and safety measures/hospital protocol to keep him safe.  Pt is divorced, does not have children; has family but he is not in contact with them and denies family or friends that he can reach out to for support.    During evaluation [facility] is seated at the bedside;He is alert/oriented x 4; appears irritated  but remains cooperative; and mood congruent with affect.  Patient is speaking in a clear tone at moderate volume, and normal pace; with good eye contact.  His thought process is coherent. There is no indication that he is currently responding to internal/external stimuli or experiencing delusional thought content.  Patient endorses suicidal ideations but but no plan verbalized today.  He denies homicidal ideation, psychosis, and paranoia.  Patient has remained cooperative throughout assessment and has answered questions appropriately.   Per ED Provider Admission Assessment 10/26/2021@1316 : History      Chief Complaint  Patient presents with   Suicidal      Dakota [facility] is a 36 y.o. male.   The history is provided by the patient and medical records. No language interpreter was used.       36 year old male with significant history of CHF and nonischemic cardiomyopathy brought here via EMS from home home with suicidal ideation.  Patient reports he is experiencing many stressors in his life that culminated into having suicidal thoughts this morning thus causing him to reach out to the suicide hotline and was brought here for further assessment.  He reported he does not have any specific suicidal plan and denies homicidal ideation, auditory or visual hallucination.  He did not go into detail his specific stressor.  He denies self-medicating with drugs or alcohol.  He is currently not on any psychiatric medication.  He is here voluntarily.  Past Psychiatric History: Major Depress/ive disorder, suicidal ideations with plan but no prior attempts; no history for inpatient psychiatric hospitalization.  Previously had outpatient therapy for about 2 years,which he reports helped but  eventually stopped d/t work schedule.    Risk to Self:  yes Risk to Others:  no Prior Inpatient Therapy: denies  Prior Outpatient Therapy:  remote history of therapy  Past Medical History:  Past Medical History:   Diagnosis Date   CHF (congestive heart failure) ([place])    V-tach ([place])     Past Surgical History:  Procedure Laterality Date   AMPUTATION Left 03/20/2012   Procedure: IRRIGATION AND DEBRIDEMENT REVISION AMPUTATION LEFT FINGER;  Surgeon: Tennis Must, MD;  Location: [facility];  Service: Orthopedics;  Laterality: Left;   I & D EXTREMITY Left 03/20/2012   Procedure: IRRIGATION LEFT LONG FINGER;  Surgeon: Tennis Must, MD;  Location: [facility];  Service: Orthopedics;  Laterality: Left;   ICD IMPLANT N/A 01/17/2021   Procedure: ICD IMPLANT;  Surgeon: Evans Lance, MD;  Location: [facility];  Service: Cardiovascular;  Laterality: N/A;   LEAD REVISION/REPAIR N/A 01/18/2021   Procedure: LEAD REVISION/REPAIR;  Surgeon: Evans Lance, MD;  Location: [facility];  Service: Cardiovascular;  Laterality: N/A;   Family History:  Family History  Problem Relation Age of Onset   Cancer Father    Family Psychiatric  History: pt denies familial mental health history Social History:  Social History   Substance and Sexual Activity  Alcohol Use No     Social History   Substance and Sexual Activity  Drug Use No    Social History   Socioeconomic History   Marital status: Divorced    Spouse name: Not on file   Number of children: Not on file   Years of education: Not on file   Highest education level: Not on file  Occupational History   Not on file  Tobacco Use   Smoking status: Never   Smokeless tobacco: Never  Vaping Use   Vaping Use: Never used  Substance and Sexual Activity   Alcohol use: No   Drug use: No   Sexual activity: Yes  Other Topics Concern   Not on file  Social History Narrative   Bachelors degree/ apt with wife/ R handed/ occasional caffeine/likes to fish   Social Determinants of Health   Financial Resource Strain: Not on file  Food Insecurity: Not on file  Transportation Needs: Not on file  Physical Activity: Not on file  Stress: Not on file  Social  Connections: Not on file   Additional Social History:    Allergies:   Allergies  Allergen Reactions   Azithromycin Rash    Pt unsure    Ciprofloxacin Rash    Labs:  Results for orders placed or performed during the hospital encounter of 10/26/21 (from the past 48 hour(s))  Resp Panel by RT-PCR (Flu A&B, Covid) Anterior Nasal Swab     Status: None   Collection Time: 10/26/21  1:44 PM   Specimen: Anterior Nasal Swab  Result Value Ref Range   SARS Coronavirus 2 by RT PCR NEGATIVE NEGATIVE    Comment: (NOTE) SARS-CoV-2 target nucleic acids are NOT DETECTED.  The SARS-CoV-2 RNA is generally detectable in upper respiratory specimens during the acute phase of infection. The lowest concentration of SARS-CoV-2 viral copies this assay can detect is 138 copies/mL. A negative result does not preclude SARS-Cov-2 infection and should not be used as the sole basis for treatment or other patient management decisions. A negative result may occur with  improper specimen collection/handling, submission of specimen other than nasopharyngeal swab, presence of viral mutation(s) within the  areas targeted by this assay, and inadequate number of viral copies(<138 copies/mL). A negative result must be combined with clinical observations, patient history, and epidemiological information. The expected result is Negative.  Fact Sheet for Patients:  EntrepreneurPulse.com.au  Fact Sheet for Healthcare Providers:  IncredibleEmployment.be  This test is no t yet approved or cleared by the [place] FDA and  has been authorized for detection and/or diagnosis of SARS-CoV-2 by FDA under an Emergency Use Authorization (EUA). This EUA will remain  in effect (meaning this test can be used) for the duration of the COVID-19 declaration under Section 564(b)(1) of the Act, 21 U.S.C.section 360bbb-3(b)(1), unless the authorization is terminated  or revoked sooner.        Influenza A by PCR NEGATIVE NEGATIVE   Influenza B by PCR NEGATIVE NEGATIVE    Comment: (NOTE) The Xpert Xpress SARS-CoV-2/FLU/RSV plus assay is intended as an aid in the diagnosis of influenza from Nasopharyngeal swab specimens and should not be used as a sole basis for treatment. Nasal washings and aspirates are unacceptable for Xpert Xpress SARS-CoV-2/FLU/RSV testing.  Fact Sheet for Patients: EntrepreneurPulse.com.au  Fact Sheet for Healthcare Providers: IncredibleEmployment.be  This test is not yet approved or cleared by the [place] FDA and has been authorized for detection and/or diagnosis of SARS-CoV-2 by FDA under an Emergency Use Authorization (EUA). This EUA will remain in effect (meaning this test can be used) for the duration of the COVID-19 declaration under Section 564(b)(1) of the Act, 21 U.S.C. section 360bbb-3(b)(1), unless the authorization is terminated or revoked.  Performed at [facility], [place] [address] [place], [place] [address]   Comprehensive metabolic panel     Status: None   Collection Time: 10/26/21  2:11 PM  Result Value Ref Range   Sodium 140 135 - 145 mmol/L   Potassium 4.3 3.5 - 5.1 mmol/L   Chloride 107 98 - 111 mmol/L   CO2 26 22 - 32 mmol/L   Glucose, Bld 84 70 - 99 mg/dL    Comment: Glucose reference range applies only to samples taken after fasting for at least 8 hours.   BUN 12 6 - 20 mg/dL   Creatinine, Ser 1.21 0.61 - 1.24 mg/dL   Calcium 9.6 8.9 - 10.3 mg/dL   Total Protein 7.7 6.5 - 8.1 g/dL   Albumin 4.4 3.5 - 5.0 g/dL   AST 21 15 - 41 U/L   ALT 20 0 - 44 U/L   Alkaline Phosphatase 55 38 - 126 U/L   Total Bilirubin 0.6 0.3 - 1.2 mg/dL   GFR, Estimated >60 >60 mL/min    Comment: (NOTE) Calculated using the CKD-EPI Creatinine Equation (2021)    Anion gap 7 5 - 15    Comment: Performed at [facility] [address] [place], [place] [address]  Ethanol     Status: None    Collection Time: 10/26/21  2:11 PM  Result Value Ref Range   Alcohol, Ethyl (B) <10 <10 mg/dL    Comment: (NOTE) Lowest detectable limit for serum alcohol is 10 mg/dL.  For medical purposes only. Performed at [facility], [place] [address], [place], [place] [address]   CBC with Diff     Status: Abnormal   Collection Time: 10/26/21  2:11 PM  Result Value Ref Range   WBC 9.8 4.0 - 10.5 K/uL   RBC 5.95 (H) 4.22 - 5.81 MIL/uL   Hemoglobin 15.6 13.0 - 17.0 g/dL   HCT  46.1 39.0 - 52.0 %   MCV 77.5 (L) 80.0 - 100.0 fL   MCH 26.2 26.0 - 34.0 pg   MCHC 33.8 30.0 - 36.0 g/dL   RDW 13.9 11.5 - 15.5 %   Platelets 246 150 - 400 K/uL   nRBC 0.0 0.0 - 0.2 %   Neutrophils Relative % 77 %   Neutro Abs 7.5 1.7 - 7.7 K/uL   Lymphocytes Relative 14 %   Lymphs Abs 1.4 0.7 - 4.0 K/uL   Monocytes Relative 8 %   Monocytes Absolute 0.8 0.1 - 1.0 K/uL   Eosinophils Relative 0 %   Eosinophils Absolute 0.0 0.0 - 0.5 K/uL   Basophils Relative 0 %   Basophils Absolute 0.0 0.0 - 0.1 K/uL   Immature Granulocytes 1 %   Abs Immature Granulocytes 0.05 0.00 - 0.07 K/uL    Comment: Performed at [facility] [address] [place], [place] [address]  Urine rapid drug screen (hosp performed)     Status: None   Collection Time: 10/26/21  5:30 PM  Result Value Ref Range   Opiates NONE DETECTED NONE DETECTED   Cocaine NONE DETECTED NONE DETECTED   Benzodiazepines NONE DETECTED NONE DETECTED   Amphetamines NONE DETECTED NONE DETECTED   Tetrahydrocannabinol NONE DETECTED NONE DETECTED   Barbiturates NONE DETECTED NONE DETECTED    Comment: (NOTE) DRUG SCREEN FOR MEDICAL PURPOSES ONLY.  IF CONFIRMATION IS NEEDED FOR ANY PURPOSE, NOTIFY LAB WITHIN 5 DAYS.  LOWEST DETECTABLE LIMITS FOR URINE DRUG SCREEN Drug Class                     Cutoff (ng/mL) Amphetamine and metabolites    1000 Barbiturate and metabolites    200 Benzodiazepine                 200 Opiates and metabolites        300 Cocaine  and metabolites        300 THC                            50 Performed at [facility], [place] [address] [place], [place] [address]     Medications:  Current Facility-Administered Medications  Medication Dose Route Frequency Provider Last Rate Last Admin   acetaminophen (TYLENOL) tablet 650 mg  650 mg Oral Q4H PRN Domenic Moras, PA-C       alum & mag hydroxide-simeth (MAALOX/MYLANTA) 200-200-20 MG/5ML suspension 30 mL  30 mL Oral Q6H PRN Domenic Moras, PA-C       atorvastatin (LIPITOR) tablet 20 mg  20 mg Oral Daily Domenic Moras, PA-C   20 mg at 10/27/21 2505   empagliflozin (JARDIANCE) tablet 10 mg  10 mg Oral Daily Domenic Moras, PA-C   10 mg at 10/27/21 0906   metoprolol succinate (TOPROL-XL) 24 hr tablet 50 mg  50 mg Oral Daily Drenda Freeze, MD   50 mg at 10/27/21 0904   ondansetron (ZOFRAN) tablet 4 mg  4 mg Oral Q8H PRN Domenic Moras, PA-C       sacubitril-valsartan (ENTRESTO) 24-26 mg per tablet  1 tablet Oral BID Drenda Freeze, MD   1 tablet at 10/27/21 0905   zolpidem (AMBIEN) tablet 5 mg  5 mg Oral QHS PRN Domenic Moras, PA-C       Current Outpatient Medications  Medication Sig Dispense Refill   atorvastatin (LIPITOR) 20 MG tablet Take  20 mg by mouth at bedtime.     empagliflozin (JARDIANCE) 10 MG TABS tablet Take 1 tablet (10 mg total) by mouth daily. 30 tablet 1   metoprolol succinate (TOPROL-XL) 50 MG 24 hr tablet Take 50 mg by mouth daily.     Multiple Vitamin (MULTIVITAMIN) capsule Take 1 capsule by mouth daily.     sacubitril-valsartan (ENTRESTO) 24-26 MG Take 1 tablet by mouth 2 (two) times daily. Please hold entresto if systolic blood pressure less than 100.     sacubitril-valsartan (ENTRESTO) 49-51 MG Take 1 tablet by mouth 2 (two) times daily. (Patient not taking: Reported on 09/26/2021) 180 tablet 3    Musculoskeletal: pt moves all extremities and ambulates independently.  Strength & Muscle Tone: within normal limits Gait & Station: normal Patient leans:  N/A  Psychiatric Specialty Exam:  Presentation  General Appearance: Appropriate for Environment; Neat  Eye Contact:Fair  Speech:Clear and Coherent; Normal Rate  Speech Volume:Normal  Handedness:Right   Mood and Affect  Mood:Dysphoric; Hopeless; Worthless  Affect:Congruent; Depressed; Constricted   Thought Process  Thought Processes:Coherent  Descriptions of Associations:Intact  Orientation:Full (Time, Place and Person)  Thought Content:Illogical  History of Schizophrenia/Schizoaffective disorder:No  Duration of Psychotic Symptoms:No data recorded Hallucinations:Hallucinations: Other (comment)  Ideas of Reference:None  Suicidal Thoughts:Suicidal Thoughts: Yes, Active (pt does not disclose plan)  Homicidal Thoughts:Homicidal Thoughts: No   Sensorium  Memory:Immediate Good; Recent Good; Remote Good  Judgment:Fair  Insight:Fair   Executive Functions  Concentration:Fair  Attention Span:Fair  [place]  Language:Good   Psychomotor Activity  Psychomotor Activity:Psychomotor Activity: Normal   Assets  Assets:Communication Skills; Financial Resources/Insurance; Housing   Sleep  Sleep:Sleep: Fair Number of Hours of Sleep: 6    Physical Exam: Physical Exam Constitutional:      Appearance: Normal appearance.  Cardiovascular:     Rate and Rhythm: Normal rate.     Pulses: Normal pulses.  Pulmonary:     Effort: Pulmonary effort is normal.  Musculoskeletal:        General: Normal range of motion.     Cervical back: Normal range of motion.  Neurological:     Mental Status: He is alert and oriented to person, place, and time. Mental status is at baseline.  Psychiatric:        Attention and Perception: Attention and perception normal.        Mood and Affect: Mood is depressed. Affect is blunt.        Speech: Speech normal.        Behavior: Behavior is agitated. Behavior is cooperative.        Thought Content:  Thought content includes suicidal ideation. Thought content does not include suicidal (does not disclose plan but in the past has had thoughts of jumping from [place] or 3rd story apartment) plan.        Cognition and Memory: Cognition and memory normal.        Judgment: Judgment is impulsive.    Review of Systems  Constitutional: Negative.   HENT: Negative.    Eyes: Negative.   Respiratory: Negative.    Cardiovascular: Negative.   Gastrointestinal: Negative.   Genitourinary: Negative.   Musculoskeletal: Negative.   Skin: Negative.   Neurological: Negative.   Endo/Heme/Allergies: Negative.   Psychiatric/Behavioral:  Positive for depression and suicidal ideas.    Blood pressure 113/77, pulse 72, temperature 98.4 F (36.9 C), temperature source Oral, resp. rate 16, SpO2 96 %. There is no height or weight on file to  calculate BMI.  Treatment Plan Summary: Patient with suicidal ideations, is guarded and does not disclose plan but within the past year has planned to jump from an undisclosed mountain or 3rd story apartment.  He endorses feelings of hopelessness, despair, no social support and cannot verbalize protective factors against self harm.  Given this, he would benefit from inpatient admission where he can be monitored for safety and mood stabilized.  Pt is not taking psychiatric medications and is very hesitant about starting them today. Anticipatory guidance provided regarding treatment plan, role of psychiatric medications to promote mood stability.  Labs were reviewed, no medical contribution to stated symptoms identified.   Daily contact with patient to assess and evaluate symptoms and progress in treatment and Medication management  Disposition: Recommend psychiatric Inpatient admission when medically cleared. Patient has cardiomyopathy and obstructive sleep apnea and uses CPAP machine.  Pt is ambulatory and independently completes ADLs, no notable functional limitations but based  on medical history may need geri psych admissions.  SW has already been notified and has faxed him out.   This service was provided via telemedicine using a 2-way, interactive audio and video technology.  Names of all persons participating in this telemedicine service and their role in this encounter. Name: Landy Bulger Role: Patient  Name: Merlyn Lot Role: PMHNP   Mallie Darting, NP 10/27/2021 11:34 AM

## 2021-10-27 NOTE — ED Notes (Signed)
Review of patient chart in purple zone and discussion with RN:  Patient is voluntary

## 2021-10-28 DIAGNOSIS — G4733 Obstructive sleep apnea (adult) (pediatric): Secondary | ICD-10-CM | POA: Insufficient documentation

## 2021-10-28 MED ORDER — MELATONIN 3 MG PO TABS: 3.0000 mg | ORAL_TABLET | Freq: Every evening | ORAL | Status: DC | PRN

## 2021-10-28 MED ORDER — ASPIRIN 81 MG PO TBEC
81.0000 mg | DELAYED_RELEASE_TABLET | Freq: Every day | ORAL | Status: DC
  Administered 2021-10-28 – 2021-10-30 (×3): 81 mg via ORAL
  Filled 2021-10-28 (×6): qty 1

## 2021-10-28 NOTE — [facility] Suicide Risk Assessment (Signed)
[facility] INPATIENT:  Family/Significant Other Suicide Prevention Education  Suicide Prevention Education:  Patient Refusal for Family/Significant Other Suicide Prevention Education: The patient [facility] has refused to provide written consent for family/significant other to be provided Family/Significant Other Suicide Prevention Education during admission and/or prior to discharge.  Physician notified.  Berlin Hun Grossman-Orr 10/28/2021, 12:10 PM

## 2021-10-28 NOTE — [facility] Counselor (Signed)
Adult Comprehensive Assessment  Patient ID: Dakota Weber, male   DOB: 06/10/1985, 36 y.o.   MRN: [id number]  Information Source: Information source: Patient  Current Stressors:  Patient states their primary concerns and needs for treatment are:: "I was at a low point in my life." Patient states their goals for this hospitilization and ongoing recovery are:: "Not to be here too long." Educational / Learning stressors: "I hated school." Employment / Job issues: "I am underpaid, but I love my job." Family Relationships: Denies stressors - "I barely speak to my family most of the time." Financial / Lack of resources (include bankruptcy): "That is the number 1 stress factor in my life." Housing / Lack of housing: "I lost my place in 2022, have been staying place to place, do not really want to be in my mom's home." Physical health (include injuries & life threatening diseases): "Diagnosed with systolic heart failure and V-Tach last year," Social relationships: Denies stressors Substance abuse: Denies stressors Bereavement / Loss: "A ton - my father, my mother-in-law, grandparents, uncles, people close to me."  Living/Environment/Situation:  Living Arrangements: Parent Living conditions (as described by patient or guardian): Has his own room, "It's okay." Who else lives in the home?: Mother, her boyfriend How long has patient lived in current situation?: a few days What is atmosphere in current home: Temporary, Other (Comment) (Heat/air do not work.)  Family History:  Marital status: Divorced What types of issues is patient dealing with in the relationship?: Trust, frequent conflicts, past infidelity Does patient have children?: No  Childhood History:  By whom was/is the patient raised?: Both parents Description of patient's relationship with caregiver when they were a child: Mother - not close; Father - tricky question - Loved both his parents but tried to avoid them because did not  trust them. Patient's description of current relationship with people who raised him/her: Father is deceased; Mother - still the same How were you disciplined when you got in trouble as a child/adolescent?: "a whoopin'" Does patient have siblings?: Yes Number of Siblings: 2 Description of patient's current relationship with siblings: Older brother, younger sister (talks a lot more to brother) Did patient suffer any verbal/emotional/physical/sexual abuse as a child?: No Did patient suffer from severe childhood neglect?: No Has patient ever been sexually abused/assaulted/raped as an adolescent or adult?: No Was the patient ever a victim of a crime or a disaster?: No Witnessed domestic violence?: No Has patient been affected by domestic violence as an adult?: No  Education:  Highest grade of school patient has completed: Engineer, maintenance (IT) Currently a student?: No  Employment/Work Situation:   Employment Situation: Employed Where is Patient Currently Employed?: 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 Patient's Job has Been Impacted by Current Illness: No Has Patient ever Been in the [employer]?: No  Financial Resources:   Financial resources: Income from employment Does patient have a representative payee or guardian?: No  Alcohol/Substance Abuse:   What has been your use of drugs/alcohol within the last 12 months?: Social drinking Alcohol/Substance Abuse Treatment Hx: Denies past history Has alcohol/substance abuse ever caused legal problems?: No  Social Support System:   Conservation officer, nature Support System: Fair Development worker, community Support System: Good friends Type of faith/religion: Christianity and Buddhism How does patient's faith help to cope with current illness?:  Always help, support, love  with Christianity.  And Buddhism helps with understanding the cycle of life.  Leisure/Recreation:   Do You Have Hobbies?: Yes Leisure and Hobbies: Fishing  Strengths/Needs:   What is the patient's perception of their strengths?: Supportive, helpful, caring, will go out of way for others, selfless Patient states they can use these personal strengths during their treatment to contribute to their recovery: "That's a good question.  I can't answer that right now." Patient states these barriers may affect/interfere with their treatment: N/A Patient states these barriers may affect their return to the community: N/A Other important information patient would like considered in planning for their treatment: N/A  Discharge Plan:   Currently receiving community mental health services: Yes (From Whom) (Has a therapist, Reavyen Wiliamson at [facility], but has not seen recently because of time limitations.) Patient states concerns and preferences for aftercare planning are: Declines medication management, is not on medicine, can set up his own therapy. Patient states they will know when they are safe and ready for discharge when: "I am ready now, I need to get back to work." Does patient have access to transportation?: Yes Does patient have financial barriers related to discharge medications?: Yes Patient description of barriers related to discharge medications: Limited income, no insurance Will patient be returning to same living situation after discharge?: Yes  Summary/Recommendations:   Summary and Recommendations (to be completed by the evaluator): Patient is a 36yo male who was admitted due to suicidal ideations without a specific plan, means or intention.  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  He has lost his housing and car, has moved back in with his mother and her boyfriend.  He is  not close to his mother and does not share his problems with her.  He also has chronic medical issues with his heart.  He denies substance abuse issues.  He does have a therapist Ms. Maricela Bo at [facility], wants to set up his own aftercare appointments.  He is not on medication and does not want a psychiatric appointment.  Patient would benefit from crisis stabilization, group therapy, psychiatric evaluations, peer interactions, and discharge planning.  On discharge, it is recommended that he adhere to the recommended aftercare plan.  Maretta Los. 10/28/2021

## 2021-10-28 NOTE — Progress Notes (Signed)
Patient signed 72 hr request for discharge at 1124 this am

## 2021-10-28 NOTE — Progress Notes (Signed)
   10/27/21 2300  Psych Admission Type (Psych Patients Only)  Admission Status Voluntary  Psychosocial Assessment  Patient Complaints Anxiety;Depression  Eye Contact Fair  Facial Expression Flat  Affect Blunted;Sad;Depressed;Anxious  Speech Logical/coherent  Interaction Minimal;Isolative  Motor Activity Fidgety  Appearance/Hygiene Unremarkable  Behavior Characteristics Cooperative;Guarded  Mood Depressed;Anxious  Thought Process  Coherency WDL  Content WDL  Delusions None reported or observed  Perception WDL  Hallucination None reported or observed  Judgment Poor  Confusion None  Danger to Self  Current suicidal ideation? Passive  Self-Injurious Behavior No self-injurious ideation or behavior indicators observed or expressed   Agreement Not to Harm Self Yes  Description of Agreement verbal  Danger to Others  Danger to Others None reported or observed

## 2021-10-28 NOTE — [facility] Suicide Risk Assessment (Signed)
Suicide Risk Assessment  Admission Assessment    [facility] Admission Suicide Risk Assessment   Nursing information obtained from:  Patient Demographic factors:  Male, Low socioeconomic status, Adolescent or young adult Current Mental Status:  Self-harm thoughts Loss Factors:  Financial problems / change in socioeconomic status Historical Factors:  Impulsivity, Family history of mental illness or substance abuse Risk Reduction Factors:  Living with another person, especially a relative, Sense of responsibility to family, Employed, Religious beliefs about death, Positive social support  Total Time spent with patient: 1.5 hours Principal Problem: MDD (major depressive disorder), recurrent severe, without psychosis ([place]) Diagnosis:  Principal Problem:   MDD (major depressive disorder), recurrent severe, without psychosis ([place]) Active Problems:   NICM (nonischemic cardiomyopathy) ([place])   Hyperlipidemia   Obstructive sleep apnea  History of Present Illness: Dakota Weber is a 36 yo [place] American male with a history of MDD & CHF who called the suicide hotline on 10/26/21 with complaints of worsening depressive symptoms and suicidal ideations in the context of financial difficulties & his car being repossessed. Pt subsequently called the EMS who transported him to the [facility], and eventually voluntarily to this [facility] for treatment and stabilization of his mood.   Continued Clinical Symptoms: Depressed mood, feelings of hopelessness, frustration anhedonia and decreased concentration levels requiring continuous hospitalization to ensure that pt is stable enough for discharge.  Alcohol Use Disorder Identification Test Final Score (AUDIT): 2 The "Alcohol Use Disorders Identification Test", Guidelines for Use in Primary Care, Second Edition.  World Pharmacologist [facility]). Score between 0-7:  no or low risk or alcohol related problems. Score between 8-15:  moderate risk of alcohol  related problems. Score between 16-19:  high risk of alcohol related problems. Score 20 or above:  warrants further diagnostic evaluation for alcohol dependence and treatment.  CLINICAL FACTORS:   Depression:   Anhedonia Hopelessness Recent sense of peace/wellbeing Severe  Musculoskeletal: Strength & Muscle Tone: within normal limits Gait & Station: normal Patient leans: N/A  Psychiatric Specialty Exam:  Presentation  General Appearance:  Appropriate for Environment; Fairly Groomed  Eye Contact: Good  Speech: Clear and Coherent  Speech Volume: Normal  Handedness: Right   Mood and Affect  Mood: Depressed  Affect: Congruent   Thought Process  Thought Processes: Coherent  Descriptions of Associations:Intact  Orientation:Full (Time, Place and Person)  Thought Content:Logical  History of Schizophrenia/Schizoaffective disorder:No  Duration of Psychotic Symptoms:No data recorded Hallucinations:Hallucinations: Other (comment)  Ideas of Reference:None  Suicidal Thoughts:Suicidal Thoughts: No  Homicidal Thoughts:Homicidal Thoughts: No   Sensorium  Memory: Immediate Good  Judgment: Fair  Insight: Good   Executive Functions  Concentration: Good  Attention Span: Good  Recall: Good  Fund of Knowledge: Good  Language: Good  Psychomotor Activity  Psychomotor Activity: Psychomotor Activity: Normal  Assets  Assets: Communication Skills; Resilience  Sleep  Sleep: Sleep: Fair Number of Hours of Sleep: 6  Physical Exam: Physical Exam Constitutional:      Appearance: Normal appearance.  HENT:     Head: Normocephalic.     Nose: Nose normal.  Eyes:     Pupils: Pupils are equal, round, and reactive to light.  Pulmonary:     Effort: Pulmonary effort is normal.  Musculoskeletal:        General: Normal range of motion.     Cervical back: Normal range of motion.  Neurological:     Mental Status: He is alert and oriented to  person, place, and time.  Review of Systems  Constitutional:  Negative for fever.  Respiratory:  Negative for cough.   Cardiovascular:  Negative for chest pain.  Gastrointestinal:  Negative for heartburn.  Neurological: Negative.   Psychiatric/Behavioral:  Positive for depression. Negative for hallucinations, memory loss, substance abuse and suicidal ideas. The patient has insomnia. The patient is not nervous/anxious.    Blood pressure 118/78, pulse (!) 104, temperature 98.6 F (37 C), temperature source Oral, resp. rate 17, height 6\' 1"  (1.854 m), weight 106.6 kg, SpO2 98 %. Body mass index is 31 kg/m.   COGNITIVE FEATURES THAT CONTRIBUTE TO RISK:  None    SUICIDE RISK:   Mild:  There are no identifiable suicide plans, no associated intent, mild dysphoria and related symptoms, good self-control (both objective and subjective assessment), few other risk factors, and identifiable protective factors, including available and accessible social support.  PLAN OF CARE:  PLAN Safety and Monitoring: Voluntary admission to inpatient psychiatric unit for safety, stabilization and treatment Daily contact with patient to assess and evaluate symptoms and progress in treatment Patient's case to be discussed in multi-disciplinary team meeting Observation Level : q15 minute checks Vital signs: q12 hours Precautions: Safety   Long Term Goal(s): Improvement in symptoms so as ready for discharge   Short Term Goals: Ability to identify changes in lifestyle to reduce recurrence of condition will improve, Ability to disclose and discuss suicidal ideas, Ability to demonstrate self-control will improve, Ability to identify and develop effective coping behaviors will improve, and Ability to identify triggers associated with substance abuse/mental health issues will improve   Independently reviewed labs on 10/28/2021: Tox screen WNL, CBC, CMP reviewed. EKG ordered. TSH, Hemoglobin A1C ordered. Vitamin D  and Vitamin B12 levels ordered.   Diagnoses:  Principal Problem:   MDD (major depressive disorder), recurrent severe, without psychosis ([facility]) Active Problems:   NICM (nonischemic cardiomyopathy) ([facility])   Hyperlipidemia   Obstructive sleep apnea   Severe recurrent major depressive disorder without psychotic features ([facility]) -Declines medication management of depressive symptoms at this time   Insomnia -Patient reports that he typical sleeps well with his CPAP.  Currently coordinating to get CPAP from [facility] as pt states that he does not have any one to bring his home CPAP machine. -Discontinued Ambien 5 mg PRN as can cause palpitations (pt with cardiomyopathy and AICD) -Start Melatonin 3 mg PRN nightly    Medical conditions Medications -Continue Lipitor 20 mg daily for hypercholesteremia (home med) -Continue Toprol XL 50 mg for cardiomyopathy (home med) -Continue Entreto 24-26 BID for cardiomyopathy (home med) -Continue ASA 81 mg daily (home med) -Continue Jardiance 10 mg daily for prediabetes (home med)   Other PRNS -Continue Tylenol 650 mg every 6 hours PRN for mild pain -Continue Maalox 30 mg every 4 hrs PRN for indigestion -Continue Zofran disintegrating tabs every 6 hrs PRN for nausea    Discharge Planning: Social work and case management to assist with discharge planning and identification of hospital follow-up needs prior to discharge Estimated LOS: 5-7 days Discharge Concerns: Need to establish a safety plan; Medication compliance and effectiveness Discharge Goals: Return home with outpatient referrals for mental health follow-up including medication management/psychotherapy    I certify that inpatient services furnished can reasonably be expected to improve the patient's condition.   [facility], NP 10/28/2021, 5:27 PM

## 2021-10-28 NOTE — Progress Notes (Addendum)
D. Pt presented flat, guarded and mildly irritable upon initial approach, complained that he "did not want to be here". Pt reported that he did not sleep well last night, and that he usually uses a CPAP, but doesn't  have anyone that will bring it to him . Pt denied SI/HI and A/VH this am. Pt observed attending group this am. A. Labs and vitals monitored. Pt given and educated on medications. Pt supported emotionally and encouraged to express concerns and ask questions.   R. Pt remains safe with 15 minute checks. Will continue POC.

## 2021-10-28 NOTE — Group Note (Signed)
LCSW Group Therapy Note  10/28/2021      Topic:  Anger Healthy and Unhealthy Coping Skills  Participation Level:  Active  Description of Group:   In this group, patients identified their own common triggers and typical reactions then analyzed how these reactions are possibly beneficial and possibly unhelpful.  Focus was placed on examining whether typical coping skills are healthy or unhealthy.  Therapeutic Goals: Patients will share situations that commonly incite their anger and how they typically respond Patients will identify how their coping skills work for them and/or against them Patients will explore possible alternative coping skills Patients will learn that anger itself is normal and that healthier reactions can assist with resolving conflict rather than worsening situations  Summary of Patient Progress:  The patient shared that his frequent cause of anger in the past was "anything."  He stated he learned in therapy that this meant he was hurt and was feeling not heard. Choice of coping skill is often now to "not react," which he said is not the same as bottling it up, which was identified as an unhealthy choice because it eventually will come out anyway; however, he was called out to see a provider and did not return.  Before he left, he did state that now he is more likely to tell someone what they did to hurt him.  Therapeutic Modalities:   Cognitive Behavioral Therapy Processing  Maretta Los, LCSW

## 2021-10-28 NOTE — Progress Notes (Signed)
Adult Psychoeducational Group Note  Date:  10/28/2021 Time:  9:05 PM  Group Topic/Focus:  Wrap-Up Group:   The focus of this group is to help patients review their daily goal of treatment and discuss progress on daily workbooks.  Participation Level:  Active  Participation Quality:  Appropriate and Attentive  Affect:  Appropriate  Cognitive:  Alert  Insight: Appropriate  Engagement in Group:  Engaged and Supportive  Modes of Intervention:  Discussion  Additional Comments:  Pt engaged in wrap up group. Pt goal was to begin preparing for discharge. Pt rated his day a 10/10.   Dakota Weber Lucy [place] 10/28/2021, 9:05 PM
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
## 2021-10-29 LAB — HEMOGLOBIN A1C
Hgb A1c MFr Bld: 5.6 % (ref 4.8–5.6)
Mean Plasma Glucose: 114.02 mg/dL

## 2021-10-29 LAB — VITAMIN D 25 HYDROXY (VIT D DEFICIENCY, FRACTURES): Vit D, 25-Hydroxy: 26.4 ng/mL — ABNORMAL LOW (ref 30–100)

## 2021-10-29 LAB — TSH: TSH: 2.468 u[IU]/mL (ref 0.350–4.500)

## 2021-10-29 LAB — VITAMIN B12: Vitamin B-12: 651 pg/mL (ref 180–914)

## 2021-10-29 NOTE — [facility] Group Notes (Signed)
[facility] Group Notes:  (Nursing/MHT/Case Management/Adjunct)  Date:  10/29/2021  Time:  9:37 AM  Type of Therapy:   Discussion  Participation Level:  Active  Participation Quality:  Appropriate  Affect:  Appropriate  Cognitive:  Appropriate  Insight:  Appropriate  Engagement in Group:  engaged  Modes of Intervention:  Discussion, Orientation, and Socialization  Summary of Progress/Problems:  Dakota Weber 10/29/2021, 9:37 AM

## 2021-10-29 NOTE — Progress Notes (Signed)
   10/29/21 1100  Psych Admission Type (Psych Patients Only)  Admission Status Voluntary  Psychosocial Assessment  Patient Complaints None  Eye Contact Fair  Facial Expression Animated  Affect Appropriate to circumstance  Speech Logical/coherent  Interaction Assertive  Motor Activity Other (Comment) (steady gait)  Appearance/Hygiene Unremarkable  Behavior Characteristics Cooperative;Appropriate to situation  Mood Anxious;Pleasant  Thought Process  Coherency WDL  Content WDL  Delusions None reported or observed  Perception WDL  Hallucination None reported or observed  Judgment Poor  Confusion None  Danger to Self  Current suicidal ideation? Denies  Self-Injurious Behavior No self-injurious ideation or behavior indicators observed or expressed   Agreement Not to Harm Self Yes  Description of Agreement verbal contract for safety  Danger to Others  Danger to Others None reported or observed

## 2021-10-29 NOTE — [facility] Group Notes (Signed)
PsychoEducational Group- Patients were given education on recognizing signs of mental health decompensation. Patients were asked to reflect and recognize signs/symptoms they noticed prior to coming to the hospital, and healthy coping mechanisms to utilize to help mental well being.  Patient participated and was appropriate.  

## 2021-10-29 NOTE — Progress Notes (Signed)
Adult Psychoeducational Group Note  Date:  10/29/2021 Time:  8:50 PM  Group Topic/Focus:  Wrap-Up Group:   The focus of this group is to help patients review their daily goal of treatment and discuss progress on daily workbooks.  Participation Level:  Active  Participation Quality:  Appropriate  Affect:  Appropriate  Cognitive:  Appropriate  Insight: Appropriate  Engagement in Group:  Engaged  Modes of Intervention:  Education and Exploration  Additional Comments:  Patient attended and participated in group tonight. He reports that he learned about gratitude today which he could take with him whenever he leaves here. He likes to laugh.  Salley Scarlet [facility] 10/29/2021, 8:50 PM

## 2021-10-29 NOTE — Group Note (Signed)
LCSW Group Therapy Note  10/29/2021      Type of Therapy and Topic:  Group Therapy: Gratitude  Participation Level:  Active   Description of Group:   In this group, patients shared and discussed the importance of acknowledging the elements in their lives for which they are grateful and how this can positively impact their mood.  The group discussed how bringing the positive elements of their lives to the forefront of their minds can help with recovery from any illness, physical or mental.  An exercise was done as a group in which a list was made of gratitude items in order to encourage participants to consider other potential positives in their lives.  Therapeutic Goals: Patients will identify one or more item for which they are grateful in each of 6 categories:  people, experience, thing, place, skill, and other. Patients will discuss how it is possible to seek out gratitude in even bad situations. Patients will explore other possible items of gratitude that they could remember.   Summary of Patient Progress:  The patient shared that his immediate feelings of gratitude were for "everybody here" which he said reminded him strongly that he has a "superpower" of friendship.  He was encouraging to others throughout group, became tearful several times when people were encouraging to him.   Therapeutic Modalities:   Solution-Focused Therapy Activity  [place]lin Hun Grossman-Orr, LCSW .

## 2021-10-29 NOTE — Progress Notes (Addendum)
[facility] MD Progress Note  10/29/2021 12:12 PM Sabastien Tyler  MRN:  [id number] Principal Problem: MDD (major depressive disorder), recurrent severe, without psychosis (HCC) Diagnosis: Principal Problem:   MDD (major depressive disorder), recurrent severe, without psychosis (HCC) Active Problems:   NICM (nonischemic cardiomyopathy) (HCC)   Hyperlipidemia   Obstructive sleep apnea  History of Present Illness: Dakota Weber is a 36 yo [place] American male with a history of MDD & CHF who called the suicide hotline on 10/26/21 with complaints of worsening depressive symptoms and suicidal ideations in the context of financial difficulties & his car being repossessed. Pt subsequently called the EMS who transported him to the [facility], and eventually voluntarily to this [facility] for treatment and stabilization of his mood.   24 hour chart review: V/S in the past 24 hrs have remained mostly WNL. Pt has not required any agitation protocol medications or antianxiety medications. He has denied SI since hospitalization, and has been visible on the unit. He has attended all unit group meetings since hospitalization, and has been very visible on the unit interacting with peers and staff.  Today's patient assessment note: Patient is continuing to be apprehensive about taking medications for mood stabilization. Education on SSRIs for treatment of his depressive symptoms have been reiterated, and pt is continuing to state that he prefers individual therapy. He is continuing to deny suicidal ideations, he denies homicidal ideations, and he denies any auditory or visual disturbances or hallucinations. He denies paranoia and there is no evidence of delusional thinking. He continues to be persistent about wanting to be discharged. He reports an improvement in his mood, states that he is helping other patients on the unit feel better about themselves, which in turn is making him feel good.  Pt signed his 72 hr  notice for discharge yesterday (10/14 @ 11.24 am). Yesterday, pt was agreeable to mental health appointments being completed for him tomorrow (10/16) and for him to be possibly discharged on Tuesday (10/17), and he was agreeable. Today, pt reports that he does not want any f/u mental health appointments being made for him. He verbalizes not being interested in following up with a mental health provider, and states that he has his own therapist and can make his own appointment to speak with his therapist.   Pt has a history of sleep apnea, and Clinical research associate and pt's assigned RN coordinated with the Charge respiratory therapist at [facility] hospital who was able to deliver a portable CPAP machine to the unit for pt.  As per pt's RN in treatment team meeting this morning, the administrative coordinator last night would not allow the CPAP to be used, and stated that the policy is for patient to have their own delivered to the hospital. At this time, pt reports that he has no one to deliver this item to him here at [facility]. Sleep last night was poor as pt did not have a cpap machine. Appetite is good. Pt denies any concerns other than wanting to be discharged. Treatment team will consider discharge tomorrow morning if pt remains free of suicidal ideations. We are continuing all of pt's home medications as listed below.  Total Time spent with patient: 30 minutes  Past Psychiatric History: MDD  Past Medical History:  Past Medical History:  Diagnosis Date   CHF (congestive heart failure) (HCC)    V-tach [facility])     Past Surgical History:  Procedure Laterality Date   AMPUTATION Left 03/20/2012   Procedure: IRRIGATION AND  DEBRIDEMENT REVISION AMPUTATION LEFT FINGER;  Surgeon: Tami Ribas, MD;  Location: [facility] OR;  Service: Orthopedics;  Laterality: Left;   I & D EXTREMITY Left 03/20/2012   Procedure: IRRIGATION LEFT LONG FINGER;  Surgeon: Tami Ribas, MD;  Location: [facility] OR;  Service: Orthopedics;  Laterality: Left;   ICD  IMPLANT N/A 01/17/2021   Procedure: ICD IMPLANT;  Surgeon: Marinus Maw, MD;  Location: [facility] INVASIVE CV LAB;  Service: Cardiovascular;  Laterality: N/A;   LEAD REVISION/REPAIR N/A 01/18/2021   Procedure: LEAD REVISION/REPAIR;  Surgeon: Marinus Maw, MD;  Location: [facility] INVASIVE CV LAB;  Service: Cardiovascular;  Laterality: N/A;   Family History:  Family History  Problem Relation Age of Onset   Cancer Father    Family Psychiatric  History: none reported Social History:  Social History   Substance and Sexual Activity  Alcohol Use No     Social History   Substance and Sexual Activity  Drug Use No    Social History   Socioeconomic History   Marital status: Divorced    Spouse name: Not on file   Number of children: Not on file   Years of education: Not on file   Highest education level: Not on file  Occupational History   Not on file  Tobacco Use   Smoking status: Never   Smokeless tobacco: Never  Vaping Use   Vaping Use: Never used  Substance and Sexual Activity   Alcohol use: No   Drug use: No   Sexual activity: Yes  Other Topics Concern   Not on file  Social History Narrative   Bachelors degree/ apt with wife/ R handed/ occasional caffeine/likes to fish   Social Determinants of Health   Financial Resource Strain: Not on file  Food Insecurity: Unknown (10/27/2021)   Hunger Vital Sign    Worried About Running Out of Food in the Last Year: Patient refused    Ran Out of Food in the Last Year: Patient refused  Transportation Needs: Unmet Transportation Needs (10/27/2021)   PRAPARE - Administrator, Civil Service (Medical): Yes    Lack of Transportation (Non-Medical): Yes  Physical Activity: Not on file  Stress: Not on file  Social Connections: Not on file   Additional Social History:   Sleep: Poor  Appetite:  Good  Current Medications: Current Facility-Administered Medications  Medication Dose Route Frequency Provider Last Rate Last Admin    acetaminophen (TYLENOL) tablet 650 mg  650 mg Oral Q4H PRN Ophelia Shoulder E, NP       alum & mag hydroxide-simeth (MAALOX/MYLANTA) 200-200-20 MG/5ML suspension 30 mL  30 mL Oral Q6H PRN Chales Abrahams, NP       aspirin EC tablet 81 mg  81 mg Oral Daily Caylor Cerino, NP   81 mg at 10/29/21 0759   atorvastatin (LIPITOR) tablet 20 mg  20 mg Oral Daily Ophelia Shoulder E, NP   20 mg at 10/29/21 0759   empagliflozin (JARDIANCE) tablet 10 mg  10 mg Oral Daily Ophelia Shoulder E, NP   10 mg at 10/29/21 0759   melatonin tablet 3 mg  3 mg Oral QHS PRN Starleen Blue, NP       metoprolol succinate (TOPROL-XL) 24 hr tablet 50 mg  50 mg Oral Daily Ophelia Shoulder E, NP   50 mg at 10/29/21 0759   ondansetron (ZOFRAN) tablet 4 mg  4 mg Oral Q8H PRN Chales Abrahams, NP  sacubitril-valsartan (ENTRESTO) 24-26 mg per tablet  1 tablet Oral BID Chales Abrahams, NP   1 tablet at 10/29/21 3845   Lab Results:  Results for orders placed or performed during the hospital encounter of 10/27/21 (from the past 48 hour(s))  Hemoglobin A1c     Status: None   Collection Time: 10/29/21  6:32 AM  Result Value Ref Range   Hgb A1c MFr Bld 5.6 4.8 - 5.6 %    Comment: (NOTE) Pre diabetes:          5.7%-6.4%  Diabetes:              >6.4%  Glycemic control for   <7.0% adults with diabetes    Mean Plasma Glucose 114.02 mg/dL    Comment: Performed at [facility], [address] [place], [place] [address]  TSH     Status: None   Collection Time: 10/29/21  6:32 AM  Result Value Ref Range   TSH 2.468 0.350 - 4.500 uIU/mL    Comment: Performed by a 3rd Generation assay with a functional sensitivity of <=0.01 uIU/mL. Performed at [facility], [address] [place], [place] [address]   Vitamin B12     Status: None   Collection Time: 10/29/21  6:32 AM  Result Value Ref Range   Vitamin B-12 651 180 - 914 pg/mL    Comment: (NOTE) This assay is not validated for testing neonatal or myeloproliferative  syndrome specimens for Vitamin B12 levels. Performed at [facility], [address], [place], [place] [address]    Blood Alcohol level:  Lab Results  Component Value Date   ETH <10 10/26/2021   ETH <10 01/15/2021   Metabolic Disorder Labs: Lab Results  Component Value Date   HGBA1C 5.6 10/29/2021   MPG 114.02 10/29/2021   No results found for: "PROLACTIN" No results found for: "CHOL", "TRIG", "HDL", "CHOLHDL", "VLDL", "LDLCALC"  Physical Findings: AIMS: n/a CIWA:   n/a COWS: n/a    Musculoskeletal: Strength & Muscle Tone: within normal limits Gait & Station: normal Patient leans: N/A  Psychiatric Specialty Exam:  Presentation  General Appearance:  Appropriate for Environment; Fairly Groomed  Eye Contact: Good  Speech: Clear and Coherent  Speech Volume: Normal  Handedness: Right  Mood and Affect  Mood: Depressed  Affect: Congruent  Thought Process  Thought Processes: Coherent  Descriptions of Associations:Intact  Orientation:Full (Time, Place and Person)  Thought Content:Logical  History of Schizophrenia/Schizoaffective disorder:No  Duration of Psychotic Symptoms:No data recorded Hallucinations:Hallucinations: None  Ideas of Reference:None  Suicidal Thoughts:Suicidal Thoughts: No  Homicidal Thoughts:Homicidal Thoughts: No  Sensorium  Memory: Immediate Good  Judgment: Fair  Insight: Fair  Art therapist  Concentration: Good  Attention Span: Good  Recall: Good  Fund of Knowledge: Good  Language: Good  Psychomotor Activity  Psychomotor Activity: Psychomotor Activity: Normal  Assets  Assets: Communication Skills  Sleep  Sleep: Sleep: Poor  Physical Exam: Physical Exam Constitutional:      Appearance: Normal appearance.  HENT:     Head: Normocephalic.     Nose: Nose normal. No congestion.  Eyes:     Pupils: Pupils are equal, round, and reactive to light.  Pulmonary:      Effort: Pulmonary effort is normal.  Musculoskeletal:        General: Normal range of motion.     Cervical back: Normal range of motion.  Neurological:     Mental Status: He is oriented to person, place,  and time.  Psychiatric:        Behavior: Behavior normal.    Review of Systems  Constitutional:  Negative for fever.  HENT:  Negative for sore throat.   Respiratory:  Negative for cough.   Cardiovascular:  Negative for chest pain.  Gastrointestinal:  Negative for heartburn and nausea.  Genitourinary: Negative.   Musculoskeletal:  Negative for myalgias.  Skin:  Negative for rash.  Neurological: Negative.   Psychiatric/Behavioral:  Positive for depression. Negative for hallucinations, memory loss, substance abuse and suicidal ideas. The patient has insomnia. The patient is not nervous/anxious.    Blood pressure 126/86, pulse 71, temperature 98.1 F (36.7 C), temperature source Oral, resp. rate 20, height 6\' 1"  (1.854 m), weight 106.6 kg, SpO2 100 %. Body mass index is 31 kg/m.  Treatment Plan Summary: Daily contact with patient to assess and evaluate symptoms and progress in treatment and Medication management   Observation Level/Precautions:  15 minute checks  Laboratory:  Labs reviewed   Psychotherapy:  Unit Group sessions  Medications:  See [facility]  Consultations:  To be determined   Discharge Concerns:  Safety, medication compliance, mood stability  Estimated LOS: 5-7 days  Other:  N/A     PLAN Safety and Monitoring: Voluntary admission to inpatient psychiatric unit for safety, stabilization and treatment Daily contact with patient to assess and evaluate symptoms and progress in treatment Patient's case to be discussed in multi-disciplinary team meeting Observation Level : q15 minute checks Vital signs: q12 hours Precautions: Safety   Long Term Goal(s): Improvement in symptoms so as ready for discharge   Short Term Goals: Ability to identify changes in lifestyle to  reduce recurrence of condition will improve, Ability to disclose and discuss suicidal ideas, Ability to demonstrate self-control will improve, Ability to identify and develop effective coping behaviors will improve, and Ability to identify triggers associated with substance abuse/mental health issues will improve   Independently reviewed labs on 10/29/2021: Tox screen WNL, CBC, CMP reviewed. EKG with QTC-391, and "Left anterior fascicular block T wave abnormality, consider inferolateral ischemia", which is same as previous EKG. Pt reports that his cardiologist is following him up for his cardiomyopathy. TSH, Hemoglobin A1C WNL. Vitamin D is pending, Vitamin B12 levels WNL.   Diagnoses:  Principal Problem:   MDD (major depressive disorder), recurrent severe, without psychosis (HCC) Active Problems:   NICM (nonischemic cardiomyopathy) (HCC)   Hyperlipidemia   Obstructive sleep apnea   Severe recurrent major depressive disorder without psychotic features (HCC) -Declines medication management of depressive symptoms at this time   Insomnia -Patient reports that he typical sleeps well with his CPAP.  Currently coordinating to get CPAP from [facility] as pt states that he does not have any one to bring his home CPAP machine. -Previously discontinued Ambien 5 mg PRN as can cause palpitations (pt with cardiomyopathy and AICD) -Continue Melatonin 3 mg PRN nightly    Medical conditions Medications -Continue Lipitor 20 mg daily for hypercholesteremia (home med) -Continue Toprol XL 50 mg for cardiomyopathy (home med) -Continue Entreto 24-26 BID for cardiomyopathy (home med) -Continue ASA 81 mg daily (home med) -Continue Jardiance 10 mg daily for prediabetes (home med)   Other PRNS -Continue Tylenol 650 mg every 6 hours PRN for mild pain -Continue Maalox 30 mg every 4 hrs PRN for indigestion -Continue Zofran disintegrating tabs every 6 hrs PRN for nausea    Discharge Planning: Social work and case  management to assist with discharge planning and identification of hospital follow-up  needs prior to discharge Estimated LOS: 5-7 days Discharge Concerns: Need to establish a safety plan; Medication compliance and effectiveness Discharge Goals: Return home with outpatient referrals for mental health follow-up including medication management/psychotherapy   I certify that inpatient services furnished can reasonably be expected to improve the patient's condition.    Nicholes Rough, NP 10/29/2021, 12:12 PM

## 2021-10-30 ENCOUNTER — Encounter (HOSPITAL_COMMUNITY): Payer: Self-pay

## 2021-10-30 DIAGNOSIS — F332 Major depressive disorder, recurrent severe without psychotic features: Secondary | ICD-10-CM

## 2021-10-30 MED ORDER — VITAMIN D3 25 MCG PO TABS: 1000.0000 [IU] | ORAL_TABLET | Freq: Every day | ORAL | Status: DC

## 2021-10-30 MED ORDER — ASPIRIN 81 MG PO TBEC: 81.0000 mg | DELAYED_RELEASE_TABLET | Freq: Every day | ORAL | 12 refills | Status: AC

## 2021-10-30 MED ORDER — MELATONIN 3 MG PO TABS: 3.0000 mg | ORAL_TABLET | Freq: Every evening | ORAL | 0 refills | Status: DC | PRN

## 2021-10-30 MED ORDER — VITAMIN D3 25 MCG PO TABS
1000.0000 [IU] | ORAL_TABLET | Freq: Every day | ORAL | Status: DC
  Administered 2021-10-30: 1000 [IU] via ORAL
  Filled 2021-10-30 (×3): qty 1

## 2021-10-30 NOTE — Progress Notes (Signed)
Patient complaining of racing heart this morning and was tearful. Patient reports that when he thinks of his divorce that happened last year he does experience panic attack and anxiety. Patient encouraged to do breathing exercise and went to breakfast afterward. Will continue to monitor and update as needed.

## 2021-10-30 NOTE — Progress Notes (Signed)
Pt discharged to lobby. Pt was stable and appreciative at that time. All papers and prescriptions were given and valuables returned. Verbal understanding expressed. Denies SI/HI and A/VH. Pt given opportunity to express concerns and ask questions.  

## 2021-10-30 NOTE — [facility] Suicide Risk Assessment (Signed)
Suicide Risk Assessment  Discharge Assessment    [facility] Discharge Suicide Risk Assessment   Principal Problem: MDD (major depressive disorder), recurrent severe, without psychosis (HCC) Discharge Diagnoses: Principal Problem:   MDD (major depressive disorder), recurrent severe, without psychosis (HCC) Active Problems:   NICM (nonischemic cardiomyopathy) (HCC)   Hyperlipidemia   Obstructive sleep apnea  Reason For Admission: Mylez Venable is a 36 yo [place] American male with a history of MDD & CHF who called the suicide hotline on 10/26/21 with complaints of worsening depressive symptoms and suicidal ideations in the context of financial difficulties & his car being repossessed. Pt subsequently called the EMS who transported him to the [facility], and eventually voluntarily to this [facility] for treatment and stabilization of his mood.                                              HOSPITAL COURSE During the patient's hospitalization, patient had extensive initial psychiatric evaluation, and follow-up psychiatric evaluations every day. Diagnoses provided upon initial assessment are: MDD (major depressive disorder), recurrent severe, without psychosis (HCC) Active Problems:   NICM (nonischemic cardiomyopathy) (HCC)   Hyperlipidemia   Obstructive sleep apnea Patient refused medication management of his depressive symptoms at admission, and stated that he preferred individual therapy. He refused for an appointment to be made for him to see an outpatient mental health provider. He reported that he has a therapist and that his intent is to make his own appointment to see the therapist after discharge. Medication adjustments at admission were as follows: Severe recurrent major depressive disorder without psychotic features (HCC) -Declines medication management of depressive symptoms at this time   Insomnia -Patient reports that he typical sleeps well with his CPAP.  CPAP obtained from [facility] as  pt states that he does not have any one to bring his home CPAP machine. He was unable to use the Cpap machine during this hospitalization as settings were unknown and the equipment also presented a ligature risk on the unit. -Discontinued Ambien 5 mg PRN as can cause palpitations (pt with cardiomyopathy and AICD) -Started Melatonin 3 mg PRN nightly    Medical conditions Medications were continued as follows: -Continue Lipitor 20 mg daily for hypercholesteremia (home med) -Continue Toprol XL 50 mg for cardiomyopathy (home med) -Continue Entreto 24-26 BID for cardiomyopathy (home med) -Continue ASA 81 mg daily (home med) -Continue Jardiance 10 mg daily for prediabetes (home med)  During the hospitalization, no other adjustments were made to the patient's medication regimen. Pt is being discharged on Melatonin 3 mg nightly as needed for sleep. He has been educated on the need to continue his medical conditions medications as listed above. He has also been educated on the need to follow up with his Cardiologist and Primary care Provider for his mental health conditions, and has verbalized understanding. Pt has stated that he will make his own appointment to see his therapist, and refused to give consent for CSW or Clinical research associate and attending Psychiatrist to call his mother for discharge safety planning and collateral information.   Patient's care was discussed during the interdisciplinary team meeting every day during the hospitalization. Gradually, patient started adjusting to milieu. The patient was evaluated each day by a clinical provider to ascertain response to treatment. Improvement was noted by the patient's report of decreasing symptoms, improved sleep and appetite, affect,  behavior, and participation in unit programming.  Patient was asked each day to complete a self inventory noting mood, mental status, pain, new symptoms, anxiety and concerns.    Symptoms were reported as significantly decreased or  resolved completely by discharge.  On day of discharge, the patient reports that their mood is stable. The patient denied having suicidal thoughts for more than 48 hours prior to discharge.  Patient denies having homicidal thoughts.  Patient denies having auditory hallucinations.  Patient denies any visual hallucinations or other symptoms of psychosis. The patient was motivated to continue taking medication with a goal of continued improvement in mental health.   The patient reports their target psychiatric symptoms of depressive symptoms responded well to the group therapy being offered on the unit, and the patient reports overall benefit  psychiatric hospitalization. Supportive psychotherapy was provided to the patient. The patient also participated in regular group therapy while hospitalized. Coping skills, problem solving as well as relaxation therapies were also part of the unit programming.  Total Time spent with patient: 30 minutes  Musculoskeletal: Strength & Muscle Tone: within normal limits Gait & Station: normal Patient leans: N/A  Psychiatric Specialty Exam  Presentation  General Appearance:  Appropriate for Environment; Fairly Groomed  Eye Contact: Good  Speech: Clear and Coherent  Speech Volume: Normal  Handedness: Right  Mood and Affect  Mood: Euthymic  Duration of Depression Symptoms: Greater than two weeks  Affect: Congruent  Thought Process  Thought Processes: Coherent  Descriptions of Associations:Intact  Orientation:Full (Time, Place and Person)  Thought Content:Logical  History of Schizophrenia/Schizoaffective disorder:Yes  Duration of Psychotic Symptoms:No data recorded Hallucinations:Hallucinations: None  Ideas of Reference:None  Suicidal Thoughts:Suicidal Thoughts: No  Homicidal Thoughts:Homicidal Thoughts: No  Sensorium  Memory: Immediate Good  Judgment: Good  Insight: Good  Executive Functions   Concentration: Good  Attention Span: Good  Recall: Good  Fund of Knowledge: Good  Language: Good  Psychomotor Activity  Psychomotor Activity: Psychomotor Activity: Normal  Assets  Assets: Communication Skills; Social Support  Sleep  Sleep: Sleep: Fair  Physical Exam: Physical Exam Constitutional:      Appearance: Normal appearance.  HENT:     Head: Normocephalic.     Nose: Nose normal. No congestion or rhinorrhea.  Eyes:     Pupils: Pupils are equal, round, and reactive to light.  Pulmonary:     Effort: Pulmonary effort is normal.  Musculoskeletal:     Cervical back: Normal range of motion.  Neurological:     Mental Status: He is alert and oriented to person, place, and time.  Psychiatric:        Thought Content: Thought content normal.    Review of Systems  Constitutional: Negative.   HENT: Negative.    Eyes: Negative.   Respiratory: Negative.    Cardiovascular: Negative.   Gastrointestinal: Negative.   Genitourinary: Negative.   Musculoskeletal: Negative.   Skin: Negative.   Neurological: Negative.   Psychiatric/Behavioral:  Positive for depression (Denies SI, denies HI, denies AVH, verbally contracts for safety outside of this [facility]). Negative for hallucinations, memory loss, substance abuse and suicidal ideas. The patient has insomnia. The patient is not nervous/anxious.    Blood pressure (!) 137/92, pulse 68, temperature 97.8 F (36.6 C), temperature source Oral, resp. rate 20, height 6\' 1"  (1.854 m), weight 106.6 kg, SpO2 100 %. Body mass index is 31 kg/m.  Mental Status Per Nursing Assessment::   On Admission:  Self-harm thoughts  Demographic Factors:  Male  Loss Factors: Financial problems/change in socioeconomic status  Historical Factors: NA  Risk Reduction Factors:   Living with another person, especially a relative  Continued Clinical Symptoms:  Medical Diagnoses and Treatments/Surgeries-Patient has a history of  cardiomyopathy which he states is stressful for him. He reports that he is following up with his Cardiologist, and is motivated to continue living. He denies SI, denies HI, denies AVH, denies paranoia, and is verbally contracting for safety outside of this [facility].  Cognitive Features That Contribute To Risk:  None    Suicide Risk:  Mild:  There are no identifiable suicide plans, no associated intent, mild dysphoria and related symptoms, good self-control (both objective and subjective assessment), few other risk factors, and identifiable protective factors, including available and accessible social support.    Follow-up Information     [facility] [place]. Schedule an appointment as soon as possible for a visit.   Specialty: Behavioral Health Why: Please call to make an appointment to start services. Contact information: [address] [phone]               Plan Of Care/Follow-up recommendations:  Labs were reviewed with the patient, and abnormal results were discussed with the patient.  The patient is able to verbalize their individual safety plan to this provider.  # It is recommended to the patient to continue psychiatric medications as prescribed, after discharge from the hospital.    # It is recommended to the patient to follow up with your outpatient psychiatric provider and PCP.  # It was discussed with the patient, the impact of alcohol, drugs, tobacco have been there overall psychiatric and medical wellbeing, and total abstinence from substance use was recommended the patient.ed.  # Prescriptions provided or sent directly to preferred pharmacy at discharge. Patient agreeable to plan. Given opportunity to ask questions. Appears to feel comfortable with discharge.    # In the event of worsening symptoms, the patient is instructed to call the crisis hotline (988), 911 and or go to  the nearest ED for appropriate evaluation and treatment of symptoms. To follow-up with primary care provider for other medical issues, concerns and or health care needs  # Patient was discharged home to his mother's home with a plan to follow up as noted above.   Nicholes Rough, NP 10/30/2021, 11:27 AM

## 2021-10-30 NOTE — Discharge Summary (Signed)
Physician Discharge Summary Note  Patient:  Dakota Weber is an 36 y.o., male MRN:  [id number] DOB:  06/05/1985 Patient phone:  [phone] (home)  Patient address:   [address] [place] [address],  Total Time spent with patient: 30 minutes  Date of Admission:  10/27/2021 Date of Discharge: 10/30/2021  Reason for Admission:  Reason For  Dakota Weber is a 36 yo [place] American male with a history of MDD & CHF who called the suicide hotline on 10/26/21 with complaints of worsening depressive symptoms and suicidal ideations in the context of financial difficulties & his car being repossessed. Pt subsequently called the EMS who transported [facility], and eventually voluntarily to this [facility] for treatment and stabilization of his mood.     Principal Problem: MDD (major depressive disorder), recurrent severe, without psychosis ([place]) Discharge Diagnoses: Principal Problem:   MDD (major depressive disorder), recurrent severe, without psychosis ([place]) Active Problems:   NICM (nonischemic cardiomyopathy) ([place])   Hyperlipidemia   Obstructive sleep apnea  Past Psychiatric History: MDD  Past Medical History:  Past Medical History:  Diagnosis Date   CHF (congestive heart failure) ([place])    V-tach ([place])     Past Surgical History:  Procedure Laterality Date   AMPUTATION Left 03/20/2012   Procedure: IRRIGATION AND DEBRIDEMENT REVISION AMPUTATION LEFT FINGER;  Surgeon: Tennis Must, MD;  Location: [place];  Service: Orthopedics;  Laterality: Left;   I & D EXTREMITY Left 03/20/2012   Procedure: IRRIGATION LEFT LONG FINGER;  Surgeon: Tennis Must, MD;  Location: [place];  Service: Orthopedics;  Laterality: Left;   ICD IMPLANT N/A 01/17/2021   Procedure: ICD IMPLANT;  Surgeon: Evans Lance, MD;  Location: [place] [facility];  Service: Cardiovascular;  Laterality: N/A;   LEAD REVISION/REPAIR N/A 01/18/2021   Procedure: LEAD REVISION/REPAIR;  Surgeon: Evans Lance, MD;  Location: [place] [facility];  Service: Cardiovascular;  Laterality: N/A;   Family History:  Family History  Problem Relation Age of Onset   Cancer Father    Family Psychiatric  History: none reported  Social History:  Social History   Substance and Sexual Activity  Alcohol Use No     Social History   Substance and Sexual Activity  Drug Use No    Social History   Socioeconomic History   Marital status: Divorced    Spouse name: Not on file   Number of children: Not on file   Years of education: Not on file   Highest education level: Not on file  Occupational History   Not on file  Tobacco Use   Smoking status: Never   Smokeless tobacco: Never  Vaping Use   Vaping Use: Never used  Substance and Sexual Activity   Alcohol use: No   Drug use: No   Sexual activity: Yes  Other Topics Concern   Not on file  Social History Narrative   Bachelors degree/ apt with wife/ R handed/ occasional caffeine/likes to fish   Social Determinants of Health   Financial Resource Strain: Not on file  Food Insecurity: Unknown (10/27/2021)   Hunger Vital Sign    Worried About Running Out of Food in the Last Year: Patient refused    [place] in the Last Year: Patient refused  Transportation Needs: Unmet Transportation Needs (10/27/2021)   PRAPARE - Hydrologist (Medical): Yes    Lack of Transportation (Non-Medical): Yes  Physical  Activity: Not on file  Stress: Not on file  Social Connections: Not on file                                            HOSPITAL COURSE During the patient's hospitalization, patient had extensive initial psychiatric evaluation, and follow-up psychiatric evaluations every day. Diagnoses provided upon initial assessment are: MDD (major depressive disorder), recurrent severe, without psychosis (HCC) Active Problems:   NICM (nonischemic cardiomyopathy) (HCC)   Hyperlipidemia   Obstructive sleep apnea Patient refused  medication management of his depressive symptoms at admission, and stated that he preferred individual therapy. He refused for an appointment to be made for him to see an outpatient mental health provider. He reported that he has a therapist and that his intent is to make his own appointment to see the therapist after discharge. Medication adjustments at admission were as follows: Severe recurrent major depressive disorder without psychotic features (HCC) -Declines medication management of depressive symptoms at this time   Insomnia -Patient reports that he typical sleeps well with his CPAP.  CPAP obtained from [facility] as pt states that he does not have any one to bring his home CPAP machine. He was unable to use the Cpap machine during this hospitalization as settings were unknown and the equipment also presented a ligature risk on the unit. -Discontinued Ambien 5 mg PRN as can cause palpitations (pt with cardiomyopathy and AICD) -Started Melatonin 3 mg PRN nightly    Medical conditions Medications were continued as follows: -Continue Lipitor 20 mg daily for hypercholesteremia (home med) -Continue Toprol XL 50 mg for cardiomyopathy (home med) -Continue Entreto 24-26 BID for cardiomyopathy (home med) -Continue ASA 81 mg daily (home med) -Continue Jardiance 10 mg daily for prediabetes (home med)   During the hospitalization, no other adjustments were made to the patient's medication regimen. Pt is being discharged on Melatonin 3 mg nightly as needed for sleep. He has been educated on the need to continue his medical conditions medications as listed above. He has also been educated on the need to follow up with his Cardiologist and Primary care Provider for his mental health conditions, and has verbalized understanding. Pt has stated that he will make his own appointment to see his therapist, and refused to give consent for CSW or Clinical research associate and attending Psychiatrist to call his mother for discharge safety  planning and collateral information.    Patient's care was discussed during the interdisciplinary team meeting every day during the hospitalization. Gradually, patient started adjusting to milieu. The patient was evaluated each day by a clinical provider to ascertain response to treatment. Improvement was noted by the patient's report of decreasing symptoms, improved sleep and appetite, affect, behavior, and participation in unit programming.  Patient was asked each day to complete a self inventory noting mood, mental status, pain, new symptoms, anxiety and concerns.     Symptoms were reported as significantly decreased or resolved completely by discharge.  On day of discharge, the patient reports that their mood is stable. The patient denied having suicidal thoughts for more than 48 hours prior to discharge.  Patient denies having homicidal thoughts.  Patient denies having auditory hallucinations.  Patient denies any visual hallucinations or other symptoms of psychosis. The patient was motivated to continue taking medication with a goal of continued improvement in mental health.    The patient reports their  target psychiatric symptoms of depressive symptoms responded well to the group therapy being offered on the unit, and the patient reports overall benefit  psychiatric hospitalization. Supportive psychotherapy was provided to the patient. The patient also participated in regular group therapy while hospitalized. Coping skills, problem solving as well as relaxation therapies were also part of the unit programming.  Physical Findings: AIMS: n/a CIWA: n/a    COWS: n/a   Musculoskeletal: Strength & Muscle Tone: within normal limits Gait & Station: normal Patient leans: N/A  Psychiatric Specialty Exam:  Presentation  General Appearance:  Appropriate for Environment; Fairly Groomed  Eye Contact: Good  Speech: Clear and Coherent  Speech Volume: Normal  Handedness: Right   Mood and  Affect  Mood: Euthymic  Affect: Congruent  Thought Process  Thought Processes: Coherent  Descriptions of Associations:Intact  Orientation:Full (Time, Place and Person)  Thought Content:Logical  History of Schizophrenia/Schizoaffective disorder:Yes  Duration of Psychotic Symptoms:No data recorded Hallucinations:Hallucinations: None  Ideas of Reference:None  Suicidal Thoughts:Suicidal Thoughts: No  Homicidal Thoughts:Homicidal Thoughts: No  Sensorium  Memory: Immediate Good  Judgment: Good  Insight: Good  Executive Functions  Concentration: Good  Attention Span: Good  Recall: Good  Fund of Knowledge: Good  Language: Good  Psychomotor Activity  Psychomotor Activity: Psychomotor Activity: Normal  Assets  Assets: Communication Skills; Social Support  Sleep  Sleep: Sleep: Fair  Physical Exam: Physical Exam Constitutional:      Appearance: Normal appearance.  HENT:     Head: Normocephalic.     Nose: Nose normal. No congestion or rhinorrhea.  Eyes:     Pupils: Pupils are equal, round, and reactive to light.  Pulmonary:     Effort: Pulmonary effort is normal. No respiratory distress.  Musculoskeletal:        General: Normal range of motion.     Cervical back: Normal range of motion.  Neurological:     Mental Status: He is alert and oriented to person, place, and time.     Sensory: No sensory deficit.     Coordination: Coordination normal.  Psychiatric:        Behavior: Behavior normal.        Thought Content: Thought content normal.    Review of Systems  Constitutional: Negative.  Negative for fever.  HENT: Negative.    Eyes: Negative.   Respiratory: Negative.    Cardiovascular: Negative.   Gastrointestinal: Negative.   Genitourinary: Negative.   Musculoskeletal: Negative.   Skin: Negative.   Neurological: Negative.   Psychiatric/Behavioral:  Positive for depression (Denies SI/HI/AVH, verbally contracts for safety outside of  the hospital). Negative for hallucinations, memory loss, substance abuse and suicidal ideas. The patient has insomnia (uses CPAP at home). The patient is not nervous/anxious.    Blood pressure (!) 137/92, pulse 68, temperature 97.8 F (36.6 C), temperature source Oral, resp. rate 20, height 6\' 1"  (1.854 m), weight 106.6 kg, SpO2 100 %. Body mass index is 31 kg/m.   Social History   Tobacco Use  Smoking Status Never  Smokeless Tobacco Never   Tobacco Cessation:  N/A, patient does not currently use tobacco products Blood Alcohol level:  Lab Results  Component Value Date   ETH <10 10/26/2021   ETH <10 27/25/3664    Metabolic Disorder Labs:  Lab Results  Component Value Date   HGBA1C 5.6 10/29/2021   MPG 114.02 10/29/2021   No results found for: "PROLACTIN" No results found for: "CHOL", "TRIG", "HDL", "CHOLHDL", "VLDL", "[place]"  See Psychiatric Specialty Exam  and Suicide Risk Assessment completed by Attending Physician prior to discharge.  Discharge destination:  Home  Is patient on multiple antipsychotic therapies at discharge:  No   Has Patient had three or more failed trials of antipsychotic monotherapy by history:  No  Recommended Plan for Multiple Antipsychotic Therapies: NA  Discharge Instructions     Diet - low sodium heart healthy   Complete by: As directed    Increase activity slowly   Complete by: As directed       Allergies as of 10/30/2021       Reactions   Azithromycin Rash   Pt unsure    Ciprofloxacin Rash        Medication List     TAKE these medications      Indication  aspirin EC 81 MG tablet Take 1 tablet (81 mg total) by mouth daily. Swallow whole. Start taking on: October 31, 2021  Indication: heart disease   atorvastatin 20 MG tablet Commonly known as: LIPITOR Take 20 mg by mouth at bedtime.  Indication: High Amount of Fats in the Blood   empagliflozin 10 MG Tabs tablet Commonly known as: JARDIANCE Take 1 tablet (10 mg  total) by mouth daily.  Indication: Cardiac Failure   Entresto 24-26 MG Generic drug: sacubitril-valsartan Take 1 tablet by mouth 2 (two) times daily. Please hold entresto if systolic blood pressure less than 100. What changed: Another medication with the same name was removed. Continue taking this medication, and follow the directions you see here.  Indication: Cardiac Failure   melatonin 3 MG Tabs tablet Take 1 tablet (3 mg total) by mouth at bedtime as needed.  Indication: Trouble Sleeping   metoprolol succinate 50 MG 24 hr tablet Commonly known as: TOPROL-XL Take 50 mg by mouth daily.  Indication: High Blood Pressure Disorder, Hypertrophic Cardiomyopathy   multivitamin capsule Take 1 capsule by mouth daily.  Indication: vitamin   vitamin D3 25 MCG tablet Commonly known as: CHOLECALCIFEROL Take 1 tablet (1,000 Units total) by mouth daily.  Indication: Vitamin D Deficiency        Follow-up Information     BE[facility] OUTPATIENT [facility]Y [place]. Schedule an appointment as soon as possible for a visit.   Specialty: Behavioral Health Why: Please call to make an appointment to start services. Contact information: [place] [id number] [place] [phone]              Follow-up recommendations:   Labs were reviewed with the patient, and abnormal results were discussed with the patient.   The patient is able to verbalize their individual safety plan to this provider.   # It is recommended to the patient to continue psychiatric medications as prescribed, after discharge from the hospital.     # It is recommended to the patient to follow up with your outpatient psychiatric provider and PCP.   # It was discussed with the patient, the impact of alcohol, drugs, tobacco have been there overall psychiatric and medical wellbeing, and total abstinence from substance use was recommended the patient.ed.   # Prescriptions  provided or sent directly to preferred pharmacy at discharge. Patient agreeable to plan. Given opportunity to ask questions. Appears to feel comfortable with discharge.    # In the event of worsening symptoms, the patient is instructed to call the crisis hotline (988), 911 and or go to the nearest ED for appropriate evaluation and treatment of symptoms. To follow-up with primary care provider  for other medical issues, concerns and or health care needs   # Patient was discharged home to his mother's home with a plan to follow up as noted above.   Signed: Nicholes Rough, NP 10/30/2021, 12:12 PM

## 2021-10-30 NOTE — BH IP Treatment Plan (Signed)
Interdisciplinary Treatment and Diagnostic Plan Update  10/30/2021 Time of Session: 10:15am Dakota [facility] MRN: [id number]  Principal Diagnosis: MDD (major depressive disorder), recurrent severe, without psychosis ([place])  Secondary Diagnoses: Principal Problem:   MDD (major depressive disorder), recurrent severe, without psychosis ([place]) Active Problems:   NICM (nonischemic cardiomyopathy) ([place])   Hyperlipidemia   Obstructive sleep apnea   Current Medications:  Current Facility-Administered Medications  Medication Dose Route Frequency Provider Last Rate Last Admin   acetaminophen (TYLENOL) tablet 650 mg  650 mg Oral Q4H PRN Merlyn Lot E, NP       alum & mag hydroxide-simeth (MAALOX/MYLANTA) 200-200-20 MG/5ML suspension 30 mL  30 mL Oral Q6H PRN Mallie Darting, NP       aspirin EC tablet 81 mg  81 mg Oral Daily Nkwenti, Doris, NP   81 mg at 10/30/21 0723   atorvastatin (LIPITOR) tablet 20 mg  20 mg Oral Daily Merlyn Lot E, NP   20 mg at 10/30/21 0723   empagliflozin (JARDIANCE) tablet 10 mg  10 mg Oral Daily Merlyn Lot E, NP   10 mg at 10/30/21 3220   melatonin tablet 3 mg  3 mg Oral QHS PRN Nicholes Rough, NP       metoprolol succinate (TOPROL-XL) 24 hr tablet 50 mg  50 mg Oral Daily Merlyn Lot E, NP   50 mg at 10/30/21 0723   ondansetron (ZOFRAN) tablet 4 mg  4 mg Oral Q8H PRN Mallie Darting, NP       sacubitril-valsartan (ENTRESTO) 24-26 mg per tablet  1 tablet Oral BID Merlyn Lot E, NP   1 tablet at 10/30/21 2542   vitamin D3 (CHOLECALCIFEROL) tablet 1,000 Units  1,000 Units Oral Daily [place], Ovid Curd, MD   1,000 Units at 10/30/21 1209   Current Outpatient Medications  Medication Sig Dispense Refill   [START ON 10/31/2021] aspirin EC 81 MG tablet Take 1 tablet (81 mg total) by mouth daily. Swallow whole. 30 tablet 12   atorvastatin (LIPITOR) 20 MG tablet Take 20 mg by mouth at bedtime.     empagliflozin (JARDIANCE) 10 MG TABS tablet Take 1 tablet (10 mg total)  by mouth daily. 30 tablet 1   melatonin 3 MG TABS tablet Take 1 tablet (3 mg total) by mouth at bedtime as needed.  0   metoprolol succinate (TOPROL-XL) 50 MG 24 hr tablet Take 50 mg by mouth daily.     Multiple Vitamin (MULTIVITAMIN) capsule Take 1 capsule by mouth daily.     sacubitril-valsartan (ENTRESTO) 24-26 MG Take 1 tablet by mouth 2 (two) times daily. Please hold entresto if systolic blood pressure less than 100.     vitamin D3 (CHOLECALCIFEROL) 25 MCG tablet Take 1 tablet (1,000 Units total) by mouth daily. 60 tablet    PTA Medications: No medications prior to admission.    Patient Stressors:    Patient Strengths:    Treatment Modalities: Medication Management, Group therapy, Case management,  1 to 1 session with clinician, Psychoeducation, Recreational therapy.   Physician Treatment Plan for Primary Diagnosis: MDD (major depressive disorder), recurrent severe, without psychosis ([place]) Long Term Goal(s): Improvement in symptoms so as ready for discharge   Short Term Goals: Ability to identify changes in lifestyle to reduce recurrence of condition will improve Ability to disclose and discuss suicidal ideas Ability to demonstrate self-control will improve Ability to identify and develop effective coping behaviors will improve Ability to identify triggers associated with substance abuse/mental health issues will improve  Medication  Management: Evaluate patient's response, side effects, and tolerance of medication regimen.  Therapeutic Interventions: 1 to 1 sessions, Unit Group sessions and Medication administration.  Evaluation of Outcomes: Adequate for Discharge  Physician Treatment Plan for Secondary Diagnosis: Principal Problem:   MDD (major depressive disorder), recurrent severe, without psychosis ([place]) Active Problems:   NICM (nonischemic cardiomyopathy) ([place])   Hyperlipidemia   Obstructive sleep apnea  Long Term Goal(s): Improvement in symptoms so as ready for  discharge   Short Term Goals: Ability to identify changes in lifestyle to reduce recurrence of condition will improve Ability to disclose and discuss suicidal ideas Ability to demonstrate self-control will improve Ability to identify and develop effective coping behaviors will improve Ability to identify triggers associated with substance abuse/mental health issues will improve     Medication Management: Evaluate patient's response, side effects, and tolerance of medication regimen.  Therapeutic Interventions: 1 to 1 sessions, Unit Group sessions and Medication administration.  Evaluation of Outcomes: Adequate for Discharge   RN Treatment Plan for Primary Diagnosis: MDD (major depressive disorder), recurrent severe, without psychosis ([place]) Long Term Goal(s): Knowledge of disease and therapeutic regimen to maintain health will improve  Short Term Goals: Ability to remain free from injury will improve, Ability to verbalize frustration and anger appropriately will improve, Ability to demonstrate self-control, Ability to participate in decision making will improve, Ability to verbalize feelings will improve, Ability to disclose and discuss suicidal ideas, Ability to identify and develop effective coping behaviors will improve, and Compliance with prescribed medications will improve  Medication Management: RN will administer medications as ordered by provider, will assess and evaluate patient's response and provide education to patient for prescribed medication. RN will report any adverse and/or side effects to prescribing provider.  Therapeutic Interventions: 1 on 1 counseling sessions, Psychoeducation, Medication administration, Evaluate responses to treatment, Monitor vital signs and CBGs as ordered, Perform/monitor CIWA, COWS, AIMS and Fall Risk screenings as ordered, Perform wound care treatments as ordered.  Evaluation of Outcomes: Adequate for Discharge   LCSW Treatment Plan for Primary  Diagnosis: MDD (major depressive disorder), recurrent severe, without psychosis ([place]) Long Term Goal(s): Safe transition to appropriate next level of care at discharge, Engage patient in therapeutic group addressing interpersonal concerns.  Short Term Goals: Engage patient in aftercare planning with referrals and resources, Increase social support, Increase ability to appropriately verbalize feelings, Increase emotional regulation, Facilitate acceptance of mental health diagnosis and concerns, Facilitate patient progression through stages of change regarding substance use diagnoses and concerns, Identify triggers associated with mental health/substance abuse issues, and Increase skills for wellness and recovery  Therapeutic Interventions: Assess for all discharge needs, 1 to 1 time with Social worker, Explore available resources and support systems, Assess for adequacy in community support network, Educate family and significant other(s) on suicide prevention, Complete Psychosocial Assessment, Interpersonal group therapy.  Evaluation of Outcomes: Adequate for Discharge   Progress in Treatment: Attending groups: Yes. Participating in groups: Yes. Taking medication as prescribed: Yes. Toleration medication: Yes. Family/Significant other contact made: No, will contact:  patient declined consents Patient understands diagnosis: Yes. Discussing patient identified problems/goals with staff: Yes. Medical problems stabilized or resolved: Yes. Denies suicidal/homicidal ideation: Yes. Issues/concerns per patient self-inventory: No.  New problem(s) identified: No, Describe:  none reported   New Short Term/Long Term Goal(s):   medication stabilization, elimination of SI thoughts, development of comprehensive mental wellness plan.    Patient Goals:  Pt states, "I want to gain more insight and gratitude"  Discharge Plan or  Barriers: No pscyhosocial barriers, declining med management and will set up  his own therapy.   Reason for Continuation of Hospitalization: Other; describe ready for discharge  Estimated Length of Stay: 0 days  Last [place] Suicide Severity Risk Score: Flowsheet Row Admission (Discharged) from 10/27/2021 in [facility] ED from 10/26/2021 in [facility] from 09/26/2021 in [place] High Risk High Risk No Risk       Last PHQ 2/9 Scores:    08/07/2019    1:07 AM  Depression screen PHQ 2/9  Decreased Interest 1  Down, Depressed, Hopeless 2  PHQ - 2 Score 3  Altered sleeping 1  Tired, decreased energy 2  Change in appetite 1  Feeling bad or failure about yourself  1  Trouble concentrating 1  Moving slowly or fidgety/restless 0  Suicidal thoughts 1  PHQ-9 Score 10  Difficult doing work/chores Very difficult    Scribe for Treatment Team: Zachery Conch, LCSW 10/30/2021 3:01 PM

## 2021-10-30 NOTE — Progress Notes (Signed)
  Dakota Weber Adult Case Management Discharge Plan :  Will you be returning to the same living situation after discharge:  Yes,  Patient to return to place of residence.  At discharge, do you have transportation home?: Yes,  Patient discharged to lobby to arrange his own [name].  Do you have the ability to pay for your medications: Yes,  BCBS Private ins.    Release of information consent forms completed and in the chart;  Patient's signature needed at discharge.  Patient to Follow up at:  Follow-up Information     [facility] [name]. Schedule an appointment as soon as possible for a visit.   Specialty: Behavioral Health Why: Please call to make an appointment to start services. Contact information: [place] [id number] [place] [phone]                Next level of care provider has access to [place] and Suicide Prevention discussed: Yes,  SPE completed with patient, declined consent to reach family/friend.    Has patient been referred to the Quitline?: N/A patient is not a smoker Tobacco Use: Low Risk  (10/27/2021)   Patient History    Smoking Tobacco Use: Never    Smokeless Tobacco Use: Never    Passive Exposure: Not on file   Patient has been referred for addiction treatment: N/A. Patient denies active substance use, UDS Negative for all, screened low risk during nursing admission (see [facility] quick tab).   Social History   Substance and Sexual Activity  Alcohol Use No   Social History   Substance and Sexual Activity  Drug Use No   Dakota Weber, LCSWA 10/30/2021, 10:25 AM

## 2021-12-19 ENCOUNTER — Other Ambulatory Visit: Payer: Self-pay

## 2021-12-19 ENCOUNTER — Other Ambulatory Visit: Payer: Self-pay | Admitting: Cardiology

## 2021-12-19 MED ORDER — ENTRESTO 24-26 MG PO TABS: 1.0000 | ORAL_TABLET | Freq: Two times a day (BID) | ORAL | 3 refills | Status: DC

## 2021-12-25 ENCOUNTER — Telehealth: Payer: Self-pay

## 2021-12-25 NOTE — Telephone Encounter (Signed)
Biotronik alert received for tachycardia 175 bpm on 12/22/21 - 12/23/2021 with unknown duration. Patient called and reports he was not active during that time but also does not recall any symptoms. States he has been doing well and has no complaints. Patient also reports compliance with meds and doses on file. Has upcoming apt with [facility] 01/17/2022. Advised patient if he were to have any symptoms to please call to let us know such as chest pain, palpitations, dizziness, shortness of breath or other concerning symptoms. Patient voiced understanding. Advised I will forward to Dr. Marguarite Arbour but do not anticipate any changes considering he has upcoming apt with [facility] but we will reach out if Dr. Ladona Ridgel does. Patient agreeable to plan and voiced understanding.

## 2021-12-28 ENCOUNTER — Other Ambulatory Visit: Payer: Self-pay

## 2021-12-28 ENCOUNTER — Emergency Department (HOSPITAL_COMMUNITY)
Admission: EM | Admit: 2021-12-28 | Discharge: 2021-12-29 | Disposition: A | Discharge status: Home / Self Care | Payer: BLUE CROSS/BLUE SHIELD | Attending: Emergency Medicine | Admitting: Emergency Medicine

## 2021-12-28 DIAGNOSIS — J101 Influenza due to other identified influenza virus with other respiratory manifestations: Secondary | ICD-10-CM

## 2021-12-28 DIAGNOSIS — Z20822 Contact with and (suspected) exposure to covid-19: Secondary | ICD-10-CM | POA: Diagnosis not present

## 2021-12-28 DIAGNOSIS — Z7982 Long term (current) use of aspirin: Secondary | ICD-10-CM | POA: Diagnosis not present

## 2021-12-28 DIAGNOSIS — R079 Chest pain, unspecified: Secondary | ICD-10-CM | POA: Diagnosis present

## 2021-12-28 LAB — CBC WITH DIFFERENTIAL/PLATELET
Abs Immature Granulocytes: 0.02 10*3/uL (ref 0.00–0.07)
Basophils Absolute: 0 10*3/uL (ref 0.0–0.1)
Basophils Relative: 1 %
Eosinophils Absolute: 0.1 10*3/uL (ref 0.0–0.5)
Eosinophils Relative: 1 %
HCT: 41.9 % (ref 39.0–52.0)
Hemoglobin: 14.5 g/dL (ref 13.0–17.0)
Immature Granulocytes: 0 %
Lymphocytes Relative: 18 %
Lymphs Abs: 1.3 10*3/uL (ref 0.7–4.0)
MCH: 26.5 pg (ref 26.0–34.0)
MCHC: 34.6 g/dL (ref 30.0–36.0)
MCV: 76.6 fL — ABNORMAL LOW (ref 80.0–100.0)
Monocytes Absolute: 1.4 10*3/uL — ABNORMAL HIGH (ref 0.1–1.0)
Monocytes Relative: 19 %
Neutro Abs: 4.5 10*3/uL (ref 1.7–7.7)
Neutrophils Relative %: 61 %
Platelets: 192 10*3/uL (ref 150–400)
RBC: 5.47 MIL/uL (ref 4.22–5.81)
RDW: 14.6 % (ref 11.5–15.5)
WBC: 7.3 10*3/uL (ref 4.0–10.5)
nRBC: 0 % (ref 0.0–0.2)

## 2021-12-28 LAB — URINALYSIS, ROUTINE W REFLEX MICROSCOPIC
Bacteria, UA: NONE SEEN
Bilirubin Urine: NEGATIVE
Glucose, UA: >=500 mg/dL — AB
Hgb urine dipstick: NEGATIVE
Ketones, ur: NEGATIVE mg/dL
Leukocytes,Ua: NEGATIVE
Nitrite: NEGATIVE
Protein, ur: NEGATIVE mg/dL
Specific Gravity, Urine: 1.027 (ref 1.005–1.030)
pH: 5 (ref 5.0–8.0)

## 2021-12-28 LAB — BASIC METABOLIC PANEL
Anion gap: 8 (ref 5–15)
BUN: 11 mg/dL (ref 6–20)
CO2: 26 mmol/L (ref 22–32)
Calcium: 8.9 mg/dL (ref 8.9–10.3)
Chloride: 104 mmol/L (ref 98–111)
Creatinine, Ser: 1.02 mg/dL (ref 0.61–1.24)
GFR, Estimated: >60 mL/min (ref >60)
Glucose, Bld: 83 mg/dL (ref 70–99)
Potassium: 3.6 mmol/L (ref 3.5–5.1)
Sodium: 138 mmol/L (ref 135–145)

## 2021-12-28 LAB — RESP PANEL BY RT-PCR (RSV, FLU A&B, COVID)  RVPGX2
Influenza A by PCR: POSITIVE — AB
Influenza B by PCR: NEGATIVE
Resp Syncytial Virus by PCR: NEGATIVE
SARS Coronavirus 2 by RT PCR: NEGATIVE

## 2021-12-28 MED ORDER — IBUPROFEN 400 MG PO TABS
600.0000 mg | ORAL_TABLET | Freq: Once | ORAL | Status: AC
  Administered 2021-12-28: 600 mg via ORAL
  Filled 2021-12-28: qty 1

## 2021-12-28 NOTE — ED Triage Notes (Signed)
Patient reports mid chest pain with mild SOB , headache , low back pain and left groin pain onset today .

## 2021-12-28 NOTE — ED Provider Triage Note (Signed)
Emergency Medicine Provider Triage Evaluation Note  Dakota Weber , a 36 y.o. male  was evaluated in triage.  Pt complains of headache, chest pain, back pain x 1 day with associated cough. Has been taking OTC cold medication w/o relief. No known fevers, vomiting. As an aside notes L groin pain x 2 days radiating to L scrotum. No penile discharge, scrotal swelling, testicular tenderness, dysuria.  Review of Systems  Positive: As above Negative: As above  Physical Exam  BP 134/88 (BP Location: Right Arm)   Pulse 80   Temp 99.1 F (37.3 C)   Resp 18   SpO2 98%  Gen:   Awake, no distress   Resp:  Normal effort  MSK:   Moves extremities without difficulty  Other:  Abdomen soft, nontender. GU exam deferred in triage.  Medical Decision Making  Medically screening exam initiated at 8:42 PM.  Appropriate orders placed.  Dakota Weber was informed that the remainder of the evaluation will be completed by another provider, this initial triage assessment does not replace that evaluation, and the importance of remaining in the ED until their evaluation is complete.  Work up initiated in triage. Afebrile without concern for SIRS/sepsis.   Antony Madura, PA-C 12/28/21 2045

## 2021-12-29 MED ORDER — BENZONATATE 100 MG PO CAPS: 100.0000 mg | ORAL_CAPSULE | Freq: Three times a day (TID) | ORAL | 0 refills | Status: DC

## 2021-12-29 MED ORDER — OSELTAMIVIR PHOSPHATE 75 MG PO CAPS: 75.0000 mg | ORAL_CAPSULE | Freq: Two times a day (BID) | ORAL | 0 refills | Status: DC

## 2021-12-29 MED ORDER — IBUPROFEN 800 MG PO TABS: 800.0000 mg | ORAL_TABLET | Freq: Three times a day (TID) | ORAL | 0 refills | Status: DC

## 2021-12-29 NOTE — ED Provider Notes (Signed)
[facility] EMERGENCY DEPARTMENT Provider Note   CSN: [id number] Arrival date & time: 12/28/21  1938     History  Chief Complaint  Patient presents with   Chest Pain    Dakota Weber is a 36 y.o. male.  The history is provided by the patient and medical records.  Chest Pain  36 year old male with history of depression, hyperlipidemia, sleep apnea, presenting to the ED with URI symptoms.  Initially began Wednesday with scratchy throat, much worse yesterday.  He reports cough, body aches, back pain, headache, and chills.  He denies any known has not had any nausea or vomiting.  He has been able to eat/drink.  No reported sick contacts.  He does work at [employer].  Did not get a flu shot this year.  Took some OTC meds Tuesday and drank hot tea with mild relief.  He does report chest pain, mostly with coughing.  No productions of mucous.  Home Medications Prior to Admission medications   Medication Sig Start Date End Date Taking? Authorizing Provider  aspirin EC 81 MG tablet Take 1 tablet (81 mg total) by mouth daily. Swallow whole. 10/31/21   Massengill, Harrold Donath, MD  atorvastatin (LIPITOR) 20 MG tablet Take 20 mg by mouth at bedtime. 10/11/20   [provider]  empagliflozin (JARDIANCE) 10 MG TABS tablet Take 1 tablet (10 mg total) by mouth daily. 01/17/21   Arty Baumgartner, NP  melatonin 3 MG TABS tablet Take 1 tablet (3 mg total) by mouth at bedtime as needed. 10/30/21   Massengill, Harrold Donath, MD  metoprolol succinate (TOPROL-XL) 50 MG 24 hr tablet Take 50 mg by mouth daily. 03/27/21   [provider]  Multiple Vitamin (MULTIVITAMIN) capsule Take 1 capsule by mouth daily.    [provider]  sacubitril-valsartan (ENTRESTO) 24-26 MG Take 1 tablet by mouth 2 (two) times daily. Please hold entresto if systolic blood pressure less than 100. 12/19/21   Lanier Prude, MD  vitamin D3 (CHOLECALCIFEROL) 25 MCG tablet Take 1 tablet (1,000 Units total) by  mouth daily. 10/30/21   Massengill, Harrold Donath, MD      Allergies    Azithromycin and Ciprofloxacin    Review of Systems   Review of Systems  Cardiovascular:  Positive for chest pain.  All other systems reviewed and are negative.   Physical Exam Updated Vital Signs BP 126/80 (BP Location: Right Arm)   Pulse 72   Temp 98.5 F (36.9 C)   Resp 16   SpO2 94%  Physical Exam Vitals and nursing note reviewed.  Constitutional:      Appearance: He is well-developed.     Comments: Seems to be feeling poorly, non-toxic  HENT:     Head: Normocephalic and atraumatic.     Nose: Congestion present.  Eyes:     Conjunctiva/sclera: Conjunctivae normal.     Pupils: Pupils are equal, round, and reactive to light.  Cardiovascular:     Rate and Rhythm: Normal rate and regular rhythm.     Heart sounds: Normal heart sounds.  Pulmonary:     Effort: Pulmonary effort is normal.     Breath sounds: Normal breath sounds. No wheezing or rhonchi.     Comments: Lungs CTAB, dry cough during exam Chest:     Comments: Chest wall non-tender Abdominal:     General: Bowel sounds are normal.     Palpations: Abdomen is soft.  Musculoskeletal:        General: Normal range of motion.  Cervical back: Normal range of motion.  Skin:    General: Skin is warm and dry.  Neurological:     Mental Status: He is alert and oriented to person, place, and time.     ED Results / Procedures / Treatments   Labs (all labs ordered are listed, but only abnormal results are displayed) Labs Reviewed  RESP PANEL BY RT-PCR (RSV, FLU A&B, COVID)  RVPGX2 - Abnormal; Notable for the following components:      Result Value   Influenza A by PCR POSITIVE (*)    All other components within normal limits  CBC WITH DIFFERENTIAL/PLATELET - Abnormal; Notable for the following components:   MCV 76.6 (*)    Monocytes Absolute 1.4 (*)    All other components within normal limits  URINALYSIS, ROUTINE W REFLEX MICROSCOPIC - Abnormal;  Notable for the following components:   Glucose, UA >=500 (*)    All other components within normal limits  BASIC METABOLIC PANEL    EKG EKG Interpretation  Date/Time:  Friday December 29 2021 00:18:14 EST Ventricular Rate:  59 PR Interval:  160 QRS Duration: 94 QT Interval:  396 QTC Calculation: 392 R Axis:   263 Text Interpretation: Sinus bradycardia Right superior axis deviation Possible Right ventricular hypertrophy T wave abnormality, consider inferior ischemia T wave abnormality, consider anterolateral ischemia Abnormal ECG When compared with ECG of 28-Oct-2021 18:11, PREVIOUS ECG IS PRESENT No significant change since last tracing Confirmed by Ross Marcus ([id number]) on 12/29/2021 1:13:33 AM  Radiology No results found.  Procedures Procedures    Medications Ordered in ED Medications  ibuprofen (ADVIL) tablet 600 mg (600 mg Oral Given 12/28/21 2103)    ED Course/ Medical Decision Making/ A&P                           Medical Decision Making Amount and/or Complexity of Data Reviewed ECG/medicine tests: ordered and independent interpretation performed.  Risk Prescription drug management.   36 y.o. M here with 2 days of URI symptoms.  Started scratchy throat wednesday, now having cough, bodyaches, headache, and chest pain with coughing. Denies SOB, N/V/D.  Afebrile, non-toxic.  Does have some congestion but lung sounds are clear bilaterally.  Denies active chest pain.  EKG does have some T wave inversions but these are not new.  His labs are overall reassuring.  RVP is positive for influenza A.  Suspect this is likely etiology of his symptoms.  Lower suspicion for ACS, PE, dissection, or other acute cardiac etiology.  We have discussed risk versus benefits of Tamiflu and he would like to try this.  Plan to discharge home with this along with other symptomatic care.  He was encouraged to rest and hydrate.  Can follow-up with PCP.  Work note provided.  Return here for any  new or acute changes.  Final Clinical Impression(s) / ED Diagnoses Final diagnoses:  Influenza A    Rx / DC Orders ED Discharge Orders          Ordered    oseltamivir (TAMIFLU) 75 MG capsule  Every 12 hours        12/29/21 0113    benzonatate (TESSALON) 100 MG capsule  Every 8 hours        12/29/21 0113    ibuprofen (ADVIL) 800 MG tablet  3 times daily        12/29/21 0113  Garlon Hatchet, PA-C 12/29/21 9470    Shon Baton, MD 12/29/21 [phone]

## 2021-12-29 NOTE — Telephone Encounter (Signed)
No change 

## 2021-12-29 NOTE — Discharge Instructions (Signed)
Take the prescribed medication as directed.  Make sure to rest and hydrate. Follow-up with your primary care doctor. Return to the ED for new or worsening symptoms.

## 2022-01-15 DIAGNOSIS — I493 Ventricular premature depolarization: Secondary | ICD-10-CM | POA: Insufficient documentation

## 2022-01-15 DIAGNOSIS — Z95810 Presence of automatic (implantable) cardiac defibrillator: Secondary | ICD-10-CM | POA: Insufficient documentation

## 2022-01-15 NOTE — Progress Notes (Unsigned)
Cardiology Office Note Date:  01/17/2022  Patient ID:  Dakota Weber, Dakota Weber 12-12-1985, MRN [id number] PCP:  Armanda Heritage, NP  Cardiologist:  Marina Goodell, MD Electrophysiologist: Vickie Epley, MD    Chief Complaint: tachycardia  History of Present Illness: Dakota Weber is a 37 y.o. male with PMH notable for NSVT, PVCs, NICM, HTN; seen today for Vickie Epley, MD for acute visit due to Biotronik alert for tachycardia.    Earlier this year, 09/2021 also had alert from biotronk of non-sustained tachycardia and VT. Patient having cold s/s, was taking cold medicine Last saw Dr. Geanie Berlin 09/2021 He also had an ER visit 09/2021 for increased palpations, lightheaded. Had missed metop day prior.  Rec'd alert from biotronik of elevated HR of 175 on 12/25/2021. Patient was called and stated he had no symptoms, feeling well, no complaints. Presented to ER 12/28/2021 with CP, congestion, cough; flu A positive.   Today, patient reports he is fully recovered from flu. He continues to workout several days a week, mostly lifting weights but does cardio sometimes. He keeps an eye on his HR while working out and once it hits 150-160, will slow down or rest to allow HR to fall. HR often climbs to that 150-160 level.   .  he denies chest pain, palpitations, dyspnea, PND, orthopnea, nausea, vomiting, dizziness, syncope, edema, weight gain, or early satiety.  In fact, he has a very large appetite.   He uses a pill box to keep medications organized. 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    Device Information: Bio ICD 01/2021 by Dr. Lovena Le, lead revision POD 1  Past Medical History:  Diagnosis Date   CHF (congestive heart failure) ([place])    V-tach [facility])     Past Surgical History:  Procedure Laterality Date   AMPUTATION Left 03/20/2012   Procedure: IRRIGATION AND DEBRIDEMENT REVISION AMPUTATION LEFT FINGER;  Surgeon: Tennis Must, MD;  Location: [place];  Service: Orthopedics;  Laterality: Left;   I & D EXTREMITY Left 03/20/2012   Procedure: IRRIGATION LEFT LONG FINGER;  Surgeon: Tennis Must, MD;  Location: [place];  Service: Orthopedics;  Laterality: Left;   ICD IMPLANT N/A 01/17/2021   Procedure: ICD IMPLANT;  Surgeon: Evans Lance, MD;  Location: [place] [facility];  Service: Cardiovascular;  Laterality: N/A;   LEAD REVISION/REPAIR N/A 01/18/2021   Procedure: LEAD REVISION/REPAIR;  Surgeon: Evans Lance, MD;  Location: [place] [facility];  Service: Cardiovascular;  Laterality: N/A;    Current Outpatient Medications  Medication Sig Dispense Refill   aspirin EC 81 MG tablet Take 1 tablet (81 mg total) by mouth daily. Swallow whole. 30 tablet 12   atorvastatin (LIPITOR) 40 MG tablet Take 40 mg by mouth daily.     empagliflozin (JARDIANCE) 10 MG TABS tablet Take 1 tablet (10 mg total) by mouth daily. 30 tablet 1   metoprolol succinate (TOPROL XL) 25 MG 24 hr tablet Take 1 tablet (25 mg total) by mouth at bedtime. 90 tablet 1   Multiple Vitamin (MULTIVITAMIN) capsule Take 1 capsule by mouth daily.     metoprolol succinate (TOPROL-XL) 50 MG 24 hr tablet Take 1 tablet (50 mg total) by mouth daily. 90 tablet 3   sacubitril-valsartan (ENTRESTO) 24-26 MG Take 1 tablet by mouth 2 (two) times daily. Please hold entresto if systolic blood pressure less than 100. 180 tablet 3   No  current facility-administered medications for this visit.    Allergies:   Azithromycin and Ciprofloxacin   Social History:  The patient  reports that he has never smoked. He has never used smokeless tobacco. He reports that he does not drink alcohol and does not use drugs.   Family History:  The patient's family history includes Cancer in his father.  ROS:  Please see the history of present illness. All other systems are reviewed and otherwise negative.   PHYSICAL EXAM:  VS:  BP 130/78   Pulse (!) 57   Ht 6\' 1"  (1.854 m)   Wt 237 lb 12.8  oz (107.9 kg)   SpO2 99%   BMI 31.37 kg/m  BMI: Body mass index is 31.37 kg/m.  GEN- The patient is well appearing, alert and oriented x 3 today.   HEENT: normocephalic, atraumatic; sclera clear, conjunctiva pink; hearing intact; oropharynx clear; neck supple, no JVP Lungs- Clear to ausculation bilaterally, normal work of breathing.  No wheezes, rales, rhonchi Heart- Regular rate and rhythm, no murmurs, rubs or gallops, PMI not laterally displaced GI- soft, non-tender, non-distended, bowel sounds present, no hepatosplenomegaly Extremities- No peripheral edema. no clubbing or cyanosis; DP/PT/radial pulses 2+ bilaterally MS- no significant deformity or atrophy Skin- warm and dry, no rash or lesion Psych- euthymic mood, full affect Neuro- strength and sensation are intact   Device interrogation done today and reviewed by myself:  Battery good Lead threshold, impedence, sensing stable  Many episodes of tachycardia in the VT1 zone; 1 tachycardia episode in the VT2 > ATP with no change in EGM No changes made today  EKG is not ordered.   Recent Labs: 09/26/2021: B Natriuretic Peptide 18.7; Magnesium 2.1 10/26/2021: ALT 20 10/29/2021: TSH 2.468 12/28/2021: BUN 11; Creatinine, Ser 1.02; Hemoglobin 14.5; Platelets 192; Potassium 3.6; Sodium 138  No results found for requested labs within last 365 days.   Estimated Creatinine Clearance: 130.2 mL/min (by C-G formula based on SCr of 1.02 mg/dL).   Wt Readings from Last 3 Encounters:  01/17/22 237 lb 12.8 oz (107.9 kg)  09/26/21 235 lb (106.6 kg)  04/21/21 240 lb 3.2 oz (109 kg)     Additional studies reviewed include: Previous EP notes.   TTE 10/20/2021 ([facility]) Left Ventricle: Systolic function is moderately abnormal. EF: 35-40%. Quantitative analysis of left ventricular Global Longitudinal Strain (GLS) imaging is -14.900%.   Left Ventricle: Doppler parameters indicate normal diastolic function.   Left Ventricle: There is moderate  global hypokinesis of the left ventricle.   Left Ventricle: Left ventricle size is at upper limit of normal.   Right Ventricle: Right ventricle size is normal.  Catheter is noted in right-sided chambers likely ICD or pacemaker lead.   Tricuspid Valve: The right ventricular systolic pressure is normal (<36 mmHg).   Aorta: The aortic root is at upper limit of normal in size - 3.5cm. The ascending aorta is normal in size.  Multicar size top limits of normal-3.3cm. No significant valvular disease was noted.     PET-CT skull base to thigh (02/09/2021) No suspicious activity concerning for sarcoid.   NM HeartSpect Rest Only (02/09/2021) Small cardiac apex defect.   TTE 01/18/2021 1. No pericardial effusion visualized. RV free wall not well visualized but based on limited views, there does not appear to be a significant effusion.   2. Left ventricular ejection fraction, by estimation, is 30 to 35%. The left ventricle demonstrates global hypokinesis. The left ventricular internal cavity size was mildly dilated.   3.  The mitral valve is normal in structure. Trivial mitral valve regurgitation.   4. Aortic dilatation noted. There is mild dilatation of the aortic root, measuring 39 mm.   5. The inferior vena cava is normal in size with greater than 50% respiratory variability, suggesting right atrial pressure of 3 mmHg.   MRI Cardiac 12/28/2020 1.  Dilated left ventricle with marked hypokinesia and ejection fraction of 28%.  2.  Delayed mid-myocardial enhancement which can be seen with nonischemic dilated cardiomyopathy. Myocarditis and sarcoid would be additional considerations.    ASSESSMENT AND PLAN:  #) NICM s/p Biotronik ICD #) ?Atrial Tach Device functioning well Tachycardia episodes EGM are same morphology as his intrinsic ventricular EGM All episodes are 1:1; not able to see beginning or end of episodes 1 episode of tachycardia in VT2 zone rec'd ATP with no change in rhythm morphology In  review with industry and PA Charlcie Cradle, this leads me to believe that the tachycardia episodes are an atrial tach and not VT He is already taking 50mg  metop XL daily - pulse is 57 today in office - Add 25mg  metop XL at bedtime; take 50mg  XL in AM - close follow-up in 1 month to determine if increased pacing burden or ongoing tachycardias. May need to adjust treatment zones.   #) HFrEF Appears euvolemic on exam Good exercise tolerance On appropriate GDMT   #) HTN at goal today.  Recommend checking blood pressures 1-2 times per week at home and recording the values.  Recommend bringing these recordings to the primary care physician.    Current medicines are reviewed at length with the patient today.   The patient does not have concerns regarding his medicines.  The following changes were made today:   ADD Metop xl 25mg  PM CONTINUE metop xl 50mg  AM  Labs/ tests ordered today include:  Orders Placed This Encounter  Procedures   CUP [place]     Disposition: Follow up with EP APP in 4 weeks   Signed, Mamie Levers, NP  01/17/22 10:39 AM   [place] [address] [phone] (office)  [phone] (fax)

## 2022-01-17 ENCOUNTER — Ambulatory Visit: Payer: BLUE CROSS/BLUE SHIELD | Attending: Physician Assistant | Admitting: Cardiology

## 2022-01-17 ENCOUNTER — Encounter: Payer: Self-pay | Admitting: Cardiology

## 2022-01-17 VITALS — BP 130/78 | HR 57 | Ht 73.0 in | Wt 237.8 lb

## 2022-01-17 DIAGNOSIS — I10 Essential (primary) hypertension: Secondary | ICD-10-CM | POA: Diagnosis not present

## 2022-01-17 DIAGNOSIS — I4729 Other ventricular tachycardia: Secondary | ICD-10-CM

## 2022-01-17 DIAGNOSIS — Z95810 Presence of automatic (implantable) cardiac defibrillator: Secondary | ICD-10-CM

## 2022-01-17 DIAGNOSIS — I428 Other cardiomyopathies: Secondary | ICD-10-CM | POA: Diagnosis not present

## 2022-01-17 DIAGNOSIS — I493 Ventricular premature depolarization: Secondary | ICD-10-CM

## 2022-01-17 LAB — CUP PACEART INCLINIC DEVICE CHECK
Date Time Interrogation Session: 20240103102128
Implantable Lead Connection Status: 753985
Implantable Lead Implant Date: 20230103
Implantable Lead Location: 753860
Implantable Lead Model: 436909
Implantable Lead Serial Number: 81418533
Implantable Pulse Generator Implant Date: 20230103
Lead Channel Pacing Threshold Amplitude: 1.2 V
Lead Channel Pacing Threshold Pulse Width: 0.4 ms
Lead Channel Sensing Intrinsic Amplitude: 10.2 mV
Pulse Gen Model: 429525
Pulse Gen Serial Number: 84879015

## 2022-01-17 MED ORDER — METOPROLOL SUCCINATE ER 25 MG PO TB24: 25.0000 mg | ORAL_TABLET | Freq: Every day | ORAL | 1 refills | Status: DC

## 2022-01-17 MED ORDER — ENTRESTO 24-26 MG PO TABS: 1.0000 | ORAL_TABLET | Freq: Two times a day (BID) | ORAL | 3 refills | Status: DC

## 2022-01-17 MED ORDER — METOPROLOL SUCCINATE ER 50 MG PO TB24: 50.0000 mg | ORAL_TABLET | Freq: Every day | ORAL | 3 refills | Status: DC

## 2022-01-17 NOTE — Patient Instructions (Signed)
Medication Instructions:    START TAKING : TOPROL XL 25 MG AT NIGHT   *If you need a refill on your cardiac medications before your next appointment, please call your pharmacy*   Lab Work: NONE ORDERED  TODAY     If you have labs (blood work) drawn today and your tests are completely normal, you will receive your results only by: [place] (if you have MyChart) OR A paper copy in the mail If you have any lab test that is abnormal or we need to change your treatment, we will call you to review the results.   Testing/Procedures: NONE ORDERED  TODAY     Follow-Up: At [facility], you and your health needs are our priority.  As part of our continuing mission to provide you with exceptional heart care, we have created designated Provider Care Teams.  These Care Teams include your primary Cardiologist (physician) and Advanced Practice Providers (APPs -  Physician Assistants and Nurse Practitioners) who all work together to provide you with the care you need, when you need it.  We recommend signing up for the patient portal called "MyChart".  Sign up information is provided on this After Visit Summary.  MyChart is used to connect with patients for Virtual Visits (Telemedicine).  Patients are able to view lab/test results, encounter notes, upcoming appointments, etc.  Non-urgent messages can be sent to your provider as well.   To learn more about what you can do with MyChart, go to [email or web address].    Your next appointment:   1-2 month(s)  The format for your next appointment:   In Person  Provider:   Tommye Standard, PA-C    Other Instructions   Important Information About Sugar

## 2022-01-18 ENCOUNTER — Ambulatory Visit (INDEPENDENT_AMBULATORY_CARE_PROVIDER_SITE_OTHER): Payer: Self-pay

## 2022-01-18 DIAGNOSIS — I428 Other cardiomyopathies: Secondary | ICD-10-CM

## 2022-01-18 LAB — CUP PACEART REMOTE DEVICE CHECK
Battery Voltage: 3.09 V
Brady Statistic RV Percent Paced: 0 %
Date Time Interrogation Session: 20240104101506
HighPow Impedance: 74 Ohm
Implantable Lead Connection Status: 753985
Implantable Lead Implant Date: 20230103
Implantable Lead Location: 753860
Implantable Lead Model: 436909
Implantable Lead Serial Number: 81418533
Implantable Pulse Generator Implant Date: 20230103
Lead Channel Impedance Value: 442 Ohm
Lead Channel Pacing Threshold Amplitude: 1.1 V
Lead Channel Pacing Threshold Pulse Width: 0.4 ms
Lead Channel Sensing Intrinsic Amplitude: 8 mV
Lead Channel Sensing Intrinsic Amplitude: 9.3 mV
Lead Channel Setting Pacing Amplitude: 2 V
Lead Channel Setting Pacing Pulse Width: 0.4 ms
Lead Channel Setting Sensing Sensitivity: 0.8 mV
Pulse Gen Model: 429525
Pulse Gen Serial Number: 84879015

## 2022-02-08 NOTE — Progress Notes (Signed)
Remote ICD transmission.   

## 2022-03-25 NOTE — Progress Notes (Unsigned)
Cardiology Office Note Date:  03/26/2022  Patient ID:  Dakota, Weber 13-Feb-1985, MRN [id number] PCP:  Armanda Heritage, NP  Cardiologist:  Dr. Geanie Berlin [facility]) Electrophysiologist: Dr. Quentin Ore     Chief Complaint:  planned f/u  History of Present Illness: Dakota Weber is a 37 y.o. male with history of NICM, ICD, PVCs, NSVT,   Strong family hx of SCD   He was admitted to the hospital 01/15/2021 following a syncopal episode and shortness of breath. His syncope occurred during stress with no prodrome, and there were concerns for ventricular tachycardia per his cardiologist Dr. Geanie Berlin at [place]. Workup at [facility] includes cardiac MRI which demonstrated an EF of 28%, RVEF 49%, and abnormal delayed mid myocardial enhancement within the septum at the mid ventricle extending into the anteroseptal wall towards the apex. Given some delayed enhancement, myocarditis or sarcoid should be considered. Troponin on admission was mildly elevated at 77 and 74.  BNP was only 37.1.  Potassium 3.7, magnesium 2.1. His lisinopril was switched to Entresto 24-26 BID along with Jardiance, and Toprol was reduced to 12.5 mg daily.    Underwent ICD implant  He last saw Dr. Quentin Ore 04/21/21, doing well, working, active.  Discussed a chronic R sided, atypical CP (for years). His Entresto up-titrated, planned for updated labs  1 year EP follow up planned  He saw Dr. Leota Sauers 09/22/21, he had a recent ER visit with palpitations (brought via EMS), symptoms occurred during an emotional time (watching a football game), no findings of concern, discharged from the ER. Atorva was increased, planned for an updated echo, labs.  He saw S. Riddle, NP 01/17/22 for a device alert with elevated HRs 12/25/21, pt reported no particular cardiac awareness/symptoms, though he had been to the ER with cough/cold symptoms and found with FLU A He was feeling well, working and exercising, good medication compliance. Tachycardic  episodes felt to be an SVT/1:1 tachycardia in review with industry (and myself). He did get ATP (without change to the rhythm) HS Toprol '25mg'$  added to his regime (in addition to his '50mg'$  morning dose) Planned to follow up in a month to revisit any increase in RV pacing, and efficacy of treatment No changes were made to his treatment zones  TODAY He feels great. No CP, palpitations or SOB No near syncope or syncope. No therapies  He continues to exercise regularly, feels great at the gym, once his HR gets to about 150 he will slow his exercise some, take a break and let it settle  He mentions that he thinks the Delene Loll is bothering his lips. He says intermittently feels like his lips are either dry/maybe cracking and perhaps a bit discolored, "feel funny". He noted it particularly last year after the increase in dose. He self reduced back to the '24mg'$  dose and is better. He stopped all his medicines for a couple days and definitely was better. Has since resumed them all and is back again And currently taking the lower entresto. He sees his primary cardiologist next week/Monday     Device information Biotronik single chamber ICD implanted 01/17/21 VDD lead POD#1 had pleuritic CP and underwent lead revsion  Past Medical History:  Diagnosis Date   CHF (congestive heart failure) ([place])    V-tach [facility])     Past Surgical History:  Procedure Laterality Date   AMPUTATION Left 03/20/2012   Procedure: IRRIGATION AND DEBRIDEMENT REVISION AMPUTATION LEFT FINGER;  Surgeon: Tennis Must, MD;  Location: [place];  Service:  Orthopedics;  Laterality: Left;   I & D EXTREMITY Left 03/20/2012   Procedure: IRRIGATION LEFT LONG FINGER;  Surgeon: Tennis Must, MD;  Location: [place];  Service: Orthopedics;  Laterality: Left;   ICD IMPLANT N/A 01/17/2021   Procedure: ICD IMPLANT;  Surgeon: Evans Lance, MD;  Location: [place] [facility];  Service: Cardiovascular;  Laterality: N/A;   LEAD REVISION/REPAIR  N/A 01/18/2021   Procedure: LEAD REVISION/REPAIR;  Surgeon: Evans Lance, MD;  Location: [place] [facility];  Service: Cardiovascular;  Laterality: N/A;    Current Outpatient Medications  Medication Sig Dispense Refill   aspirin EC 81 MG tablet Take 1 tablet (81 mg total) by mouth daily. Swallow whole. 30 tablet 12   atorvastatin (LIPITOR) 40 MG tablet Take 40 mg by mouth daily.     empagliflozin (JARDIANCE) 10 MG TABS tablet Take 1 tablet (10 mg total) by mouth daily. 30 tablet 1   metoprolol succinate (TOPROL XL) 25 MG 24 hr tablet Take 1 tablet (25 mg total) by mouth at bedtime. 90 tablet 1   metoprolol succinate (TOPROL-XL) 50 MG 24 hr tablet Take 1 tablet (50 mg total) by mouth daily. 90 tablet 3   Multiple Vitamin (MULTIVITAMIN) capsule Take 1 capsule by mouth daily.     sacubitril-valsartan (ENTRESTO) 24-26 MG Take 1 tablet by mouth 2 (two) times daily. Please hold entresto if systolic blood pressure less than 100. 180 tablet 3   No current facility-administered medications for this visit.    Allergies:   Azithromycin and Ciprofloxacin   Social History:  The patient  reports that he has never smoked. He has never used smokeless tobacco. He reports that he does not drink alcohol and does not use drugs.   Family History:  The patient's family history includes Cancer in his father.  ROS:  Please see the history of present illness.    All other systems are reviewed and otherwise negative.   PHYSICAL EXAM:  VS:  BP 108/68   Pulse 60   Ht '6\' 1"'$  (1.854 m)   Wt 236 lb (107 kg)   SpO2 97%   BMI 31.14 kg/m  BMI: Body mass index is 31.14 kg/m. Well nourished, well developed, in no acute distress HEENT: normocephalic, atraumatic Neck: no JVD, carotid bruits or masses Cardiac:   RRR; no significant murmurs, no rubs, or gallops Lungs:   CTA b/l, no wheezing, rhonchi or rales Abd: soft, nontender MS: no deformity or  atrophy Ext:  no edema Skin: warm and dry, no rash Neuro:  No  gross deficits appreciated Psych: euthymic mood, full affect  ICD site is stable, no tethering or discomfort   EKG:  not done today  Device interrogation done today and reviewed by myself:  Battery and lead measurements are good No programming changes made Only a couple NSVT episodes, are 1:1, all in 1st zone (no therapies in this zone)   10/20/21: TTE Left Ventricle: Systolic function is moderately abnormal. EF: 35-40%.  Quantitative analysis of left ventricular Global Longitudinal Strain (GLS)  imaging is -14.900%.    Left Ventricle: Doppler parameters indicate normal diastolic function.    Left Ventricle: There is moderate global  hypokinesis of the left  ventricle.   Left Ventricle: Left ventricle size is at upper limit of normal.    Right Ventricle: Right ventricle size is normal.  Catheter is noted in  right-sided chambers likely ICD or pacemaker lead.    Tricuspid Valve: The right ventricular systolic  pressure is normal (<36  mmHg).   Aorta: The aortic root is at upper limit of normal in size - 3.5cm. The  ascending aorta is normal in size.  Multicar size top limits of normal-3.3  cm.   No significant valvular disease was noted.   01/18/21: TTE 1. No pericardial effusion visualized. RV free wall not well visualized  but based on limited views, there does not appear to be a significant  effusion.   2. Left ventricular ejection fraction, by estimation, is 30 to 35%. The  left ventricle demonstrates global hypokinesis. The left ventricular  internal cavity size was mildly dilated.   3. The mitral valve is normal in structure. Trivial mitral valve  regurgitation.   4. Aortic dilatation noted. There is mild dilatation of the aortic root,  measuring 39 mm.   5. The inferior vena cava is normal in size with greater than 50%  respiratory variability, suggesting right atrial pressure of 3 mmHg.     From [facility] record:  -- Cardiac echo from September, 2022 with moderately  reduced LV systolic function with EF 35 to 40% as above.  -- Coronary CTA revealed normal coronaries with calcium score 0 from October 2022 as above. -- Cardiac MRI from December 2022 revealed severely reduced LV systolic function with global hypokinesis and LVEF 28%. Dilated cardiomyopathy, myocarditis and sarcoidosis are on the differential as per report. -- PET/CT study was done in January of this year 2023 and no evidence of sarcoidosis was noted.   Recent Labs: 09/26/2021: B Natriuretic Peptide 18.7; Magnesium 2.1 10/26/2021: ALT 20 10/29/2021: TSH 2.468 12/28/2021: BUN 11; Creatinine, Ser 1.02; Hemoglobin 14.5; Platelets 192; Potassium 3.6; Sodium 138  No results found for requested labs within last 365 days.   CrCl cannot be calculated (Patient's most recent lab result is older than the maximum 21 days allowed.).   Wt Readings from Last 3 Encounters:  03/26/22 236 lb (107 kg)  01/17/22 237 lb 12.8 oz (107.9 kg)  09/26/21 235 lb (106.6 kg)     Other studies reviewed: Additional studies/records reviewed today include: summarized above  ASSESSMENT AND PLAN:  ICD Intact function No programming changes made  NICM Chronic CHF (systolic) he has had some improvement in his LVEF No symptoms or exam findings of volume  C/w Dr. Geanie Berlin  Unclear, though seems to be having some kind of side effect/reaction to the [facility], lips.  He does not describe angioedema, but definitely feels something unusual in his lips Instructed him to go ahead and hold it for now and see if it resolves and discuss with his primary cardiologist on Monday   Disposition: F/u with remotes as usual, EP in 4 mo, sooner if needed  Current medicines are reviewed at length with the patient today.  The patient did not have any concerns regarding medicines.  Venetia Night, PA-C 03/26/2022 11:07 AM     [facility] [place] [phone] (office)   [phone] (fax)

## 2022-03-26 ENCOUNTER — Encounter: Payer: Self-pay | Admitting: Physician Assistant

## 2022-03-26 ENCOUNTER — Ambulatory Visit: Payer: BLUE CROSS/BLUE SHIELD | Attending: Physician Assistant | Admitting: Physician Assistant

## 2022-03-26 VITALS — BP 108/68 | HR 60 | Ht 73.0 in | Wt 236.0 lb

## 2022-03-26 DIAGNOSIS — Z95810 Presence of automatic (implantable) cardiac defibrillator: Secondary | ICD-10-CM | POA: Diagnosis not present

## 2022-03-26 DIAGNOSIS — I5022 Chronic systolic (congestive) heart failure: Secondary | ICD-10-CM

## 2022-03-26 DIAGNOSIS — I428 Other cardiomyopathies: Secondary | ICD-10-CM

## 2022-03-26 LAB — CUP PACEART INCLINIC DEVICE CHECK
Date Time Interrogation Session: 20240311131257
Implantable Lead Connection Status: 753985
Implantable Lead Implant Date: 20230103
Implantable Lead Location: 753860
Implantable Lead Model: 436909
Implantable Lead Serial Number: 81418533
Implantable Pulse Generator Implant Date: 20230103
Lead Channel Pacing Threshold Amplitude: 1.1 V
Lead Channel Pacing Threshold Pulse Width: 0.4 ms
Lead Channel Sensing Intrinsic Amplitude: 12 mV
Lead Channel Sensing Intrinsic Amplitude: 28 mV
Pulse Gen Model: 429525
Pulse Gen Serial Number: 84879015

## 2022-03-26 NOTE — Patient Instructions (Signed)
Medication Instructions:    Your physician recommends that you continue on your current medications as directed. Please refer to the Current Medication list given to you today.  HOLD ENTRESTO UNTIL OFFICE VISIT MONDAY   *If you need a refill on your cardiac medications before your next appointment, please call your pharmacy*   Lab Work:  [place]    If you have labs (blood work) drawn today and your tests are completely normal, you will receive your results only by: [place] (if you have MyChart) OR A paper copy in the mail If you have any lab test that is abnormal or we need to change your treatment, we will call you to review the results.   Testing/Procedures:  NONE ORDERED  TODAY     Follow-Up: At [facility], you and your health needs are our priority.  As part of our continuing mission to provide you with exceptional heart care, we have created designated Provider Care Teams.  These Care Teams include your primary Cardiologist (physician) and Advanced Practice Providers (APPs -  Physician Assistants and Nurse Practitioners) who all work together to provide you with the care you need, when you need it.  We recommend signing up for the patient portal called "MyChart".  Sign up information is provided on this After Visit Summary.  MyChart is used to connect with patients for Virtual Visits (Telemedicine).  Patients are able to view lab/test results, encounter notes, upcoming appointments, etc.  Non-urgent messages can be sent to your provider as well.   To learn more about what you can do with MyChart, go to [email or web address].    Your next appointment:   4 month(s)  Provider:   You may see Vickie Epley, MD or one of the following Advanced Practice Providers on your designated Care Team:   Tommye Standard, [place]  Other Instructions

## 2022-04-16 ENCOUNTER — Other Ambulatory Visit: Payer: Self-pay

## 2022-04-16 ENCOUNTER — Observation Stay (HOSPITAL_COMMUNITY)
Admission: EM | Admit: 2022-04-16 | Discharge: 2022-04-17 | Disposition: A | Discharge status: Home / Self Care | Payer: BLUE CROSS/BLUE SHIELD | Attending: Internal Medicine | Admitting: Internal Medicine

## 2022-04-16 ENCOUNTER — Encounter (HOSPITAL_COMMUNITY): Payer: Self-pay | Admitting: Emergency Medicine

## 2022-04-16 ENCOUNTER — Emergency Department (HOSPITAL_COMMUNITY): Payer: BLUE CROSS/BLUE SHIELD

## 2022-04-16 DIAGNOSIS — Z7982 Long term (current) use of aspirin: Secondary | ICD-10-CM | POA: Insufficient documentation

## 2022-04-16 DIAGNOSIS — Z79899 Other long term (current) drug therapy: Secondary | ICD-10-CM | POA: Diagnosis not present

## 2022-04-16 DIAGNOSIS — R0789 Other chest pain: Secondary | ICD-10-CM | POA: Diagnosis not present

## 2022-04-16 DIAGNOSIS — E785 Hyperlipidemia, unspecified: Secondary | ICD-10-CM | POA: Diagnosis not present

## 2022-04-16 DIAGNOSIS — I5020 Unspecified systolic (congestive) heart failure: Secondary | ICD-10-CM | POA: Insufficient documentation

## 2022-04-16 DIAGNOSIS — Z95810 Presence of automatic (implantable) cardiac defibrillator: Secondary | ICD-10-CM | POA: Insufficient documentation

## 2022-04-16 DIAGNOSIS — R079 Chest pain, unspecified: Secondary | ICD-10-CM | POA: Diagnosis not present

## 2022-04-16 LAB — BASIC METABOLIC PANEL
Anion gap: 10 (ref 5–15)
BUN: 12 mg/dL (ref 6–20)
CO2: 29 mmol/L (ref 22–32)
Calcium: 9.7 mg/dL (ref 8.9–10.3)
Chloride: 98 mmol/L (ref 98–111)
Creatinine, Ser: 1.22 mg/dL (ref 0.61–1.24)
GFR, Estimated: >60 mL/min (ref >60)
Glucose, Bld: 77 mg/dL (ref 70–99)
Potassium: 3.6 mmol/L (ref 3.5–5.1)
Sodium: 137 mmol/L (ref 135–145)

## 2022-04-16 LAB — MAGNESIUM: Magnesium: 2.3 mg/dL (ref 1.7–2.4)

## 2022-04-16 LAB — HEPATIC FUNCTION PANEL
ALT: 15 U/L (ref 0–44)
AST: 21 U/L (ref 15–41)
Albumin: 4.4 g/dL (ref 3.5–5.0)
Alkaline Phosphatase: 51 U/L (ref 38–126)
Bilirubin, Direct: 0.1 mg/dL (ref 0.0–0.2)
Indirect Bilirubin: 0.8 mg/dL (ref 0.3–0.9)
Total Bilirubin: 0.9 mg/dL (ref 0.3–1.2)
Total Protein: 7.8 g/dL (ref 6.5–8.1)

## 2022-04-16 LAB — CBC
HCT: 45.9 % (ref 39.0–52.0)
Hemoglobin: 15.3 g/dL (ref 13.0–17.0)
MCH: 25.8 pg — ABNORMAL LOW (ref 26.0–34.0)
MCHC: 33.3 g/dL (ref 30.0–36.0)
MCV: 77.5 fL — ABNORMAL LOW (ref 80.0–100.0)
Platelets: 212 10*3/uL (ref 150–400)
RBC: 5.92 MIL/uL — ABNORMAL HIGH (ref 4.22–5.81)
RDW: 14.4 % (ref 11.5–15.5)
WBC: 7.6 10*3/uL (ref 4.0–10.5)
nRBC: 0 % (ref 0.0–0.2)

## 2022-04-16 LAB — LIPASE, BLOOD: Lipase: 32 U/L (ref 11–51)

## 2022-04-16 LAB — TROPONIN I (HIGH SENSITIVITY)
Troponin I (High Sensitivity): 11 ng/L (ref <18)
Troponin I (High Sensitivity): 10 ng/L (ref <18)

## 2022-04-16 NOTE — ED Triage Notes (Signed)
Per [facility] pt coming from work- states Thursday starting having PVCs and has progressed over the past few days. At work today started feeling light headed.

## 2022-04-16 NOTE — ED Provider Notes (Signed)
[facility] Provider Note   CSN: [id number] Arrival date & time: 04/16/22  1325     History  Chief Complaint  Patient presents with   Chest Pain    Dakota Weber is a 37 y.o. male with PMH nonischemic cardiomyopathy, nonsustained ventricular tachycardia s/p ICD implant on 01/17/21, HLD presenting with multiple episodes of heavy pressure-like chest pain on the left side that is nonradiating for the past few days.  Episodes will last for couple hours and self resolved.  No particular triggers noted. Patient has had associated shortness of breath intermittently that is also worse on exertion.  Patient has had some intermittent headache.  Not currently experiencing symptoms. Denies fever, abdominal pain, nausea, vomiting, diarrhea.   Chest Pain      Home Medications Prior to Admission medications   Medication Sig Start Date End Date Taking? Authorizing Provider  aspirin EC 81 MG tablet Take 1 tablet (81 mg total) by mouth daily. Swallow whole. 10/31/21   Massengill, Ovid Curd, MD  atorvastatin (LIPITOR) 40 MG tablet Take 40 mg by mouth daily. 01/04/22   [provider]  empagliflozin (JARDIANCE) 10 MG TABS tablet Take 1 tablet (10 mg total) by mouth daily. 01/17/21   Cheryln Manly, NP  metoprolol succinate (TOPROL XL) 25 MG 24 hr tablet Take 1 tablet (25 mg total) by mouth at bedtime. 01/17/22   Mamie Levers, NP  metoprolol succinate (TOPROL-XL) 50 MG 24 hr tablet Take 1 tablet (50 mg total) by mouth daily. 01/17/22   Mamie Levers, NP  Multiple Vitamin (MULTIVITAMIN) capsule Take 1 capsule by mouth daily.    [provider]  sacubitril-valsartan (ENTRESTO) 24-26 MG Take 1 tablet by mouth 2 (two) times daily. Please hold entresto if systolic blood pressure less than 100. 01/17/22   Mamie Levers, NP      Allergies    Azithromycin and Ciprofloxacin    Review of Systems   Review of Systems  Cardiovascular:  Positive for  chest pain.  See HPI  Physical Exam Updated Vital Signs BP 126/85   Pulse (!) 53   Temp 98.1 F (36.7 C) (Oral)   Resp (!) 22   Ht 6\' 1"  (1.854 m)   Wt 104.3 kg   SpO2 99%   BMI 30.34 kg/m  Physical Exam Vitals and nursing note reviewed.  Constitutional:      General: He is not in acute distress.    Appearance: He is well-developed.  HENT:     Head: Normocephalic and atraumatic.  Eyes:     Extraocular Movements: Extraocular movements intact.     Conjunctiva/sclera: Conjunctivae normal.     Pupils: Pupils are equal, round, and reactive to light.  Cardiovascular:     Rate and Rhythm: Regular rhythm. Bradycardia present.     Pulses:          Radial pulses are 2+ on the right side and 2+ on the left side.       Dorsalis pedis pulses are 2+ on the right side and 2+ on the left side.     Heart sounds: No murmur heard. Pulmonary:     Effort: Pulmonary effort is normal. No respiratory distress.     Breath sounds: Normal breath sounds.  Abdominal:     Palpations: Abdomen is soft.     Tenderness: There is no abdominal tenderness.  Musculoskeletal:        General: No swelling.     Cervical back: Neck supple.  Skin:    General: Skin is warm and dry.     Capillary Refill: Capillary refill takes less than 2 seconds.  Neurological:     Mental Status: He is alert.  Psychiatric:        Mood and Affect: Mood normal.     ED Results / Procedures / Treatments   Labs (all labs ordered are listed, but only abnormal results are displayed) Labs Reviewed  CBC - Abnormal; Notable for the following components:      Result Value   RBC 5.92 (*)    MCV 77.5 (*)    MCH 25.8 (*)    All other components within normal limits  BASIC METABOLIC PANEL  HEPATIC FUNCTION PANEL  MAGNESIUM  LIPASE, BLOOD  TROPONIN I (HIGH SENSITIVITY)  TROPONIN I (HIGH SENSITIVITY)    EKG EKG Interpretation  Date/Time:  Monday April 16 2022 13:42:16 EDT Ventricular Rate:  59 PR Interval:  164 QRS  Duration: 106 QT Interval:  402 QTC Calculation: 397 R Axis:   256 Text Interpretation: Sinus bradycardia Right superior axis deviation Possible Anterior infarct , age undetermined T wave abnormality, consider inferolateral ischemia Abnormal ECG When compared with ECG of 29-Dec-2021 00:18, PREVIOUS ECG IS PRESENT when compared to prior, overall similar appearance. no STEMI Confirmed by Antony Blackbird [id number]) on 04/16/2022 6:44:31 PM  Radiology DG Chest 2 View  Result Date: 04/16/2022 CLINICAL DATA:  Chest pain EXAM: CHEST - 2 VIEW COMPARISON:  CXR 09/26/21 FINDINGS: Left-sided single lead cardiac device in place with lead in the right ventricle. No pleural effusion. No pneumothorax. No focal airspace opacity. No radiographically apparent displaced rib fracture. Visualized upper abdomen is unremarkable. Vertebral body heights are maintained. IMPRESSION: No radiographic finding to explain chest pain. Electronically Signed   By: Marin Roberts M.D.   On: 04/16/2022 14:13    Medications Ordered in ED Medications - No data to display  ED Course/ Medical Decision Making/ A&P Clinical Course as of 04/16/22 2344  Mon Apr 16, 2022  2043 Cards  [CO]  2135 Cardiology to come see [CO]  2342 Card admit for echo in the AM and to follow [CO]    Clinical Course User Index [CO] Bradd Canary, MD                             Medical Decision Making Amount and/or Complexity of Data Reviewed Labs: ordered. Radiology: ordered.  Risk Decision regarding hospitalization.   Patient presents hemodynamically stable, slightly bradycardic in the 50s.  He is saturating well on room air, slightly tachypneic.  On exam he has no focal abnormal lung sounds, no abdominal tenderness and no significant signs of fluid overload such as edema.  Labs are overall reassuring, no leukocytosis or significant anemia noted.  Patient has no electrolyte abnormalities and troponin, lipase and magnesium are all WNL He has no acute  EKG changes.  He is sinus rhythm, bradycardic with no signs of ischemic changes.  Discussed with cardiology due to patient's history.  Per the recommendations patient will be admitted for echo in the morning.        Final Clinical Impression(s) / ED Diagnoses Final diagnoses:  Atypical chest pain     Bradd Canary, MD 04/16/22 2356    Tegeler, Gwenyth Allegra, MD 04/18/22 [phone]

## 2022-04-16 NOTE — ED Provider Triage Note (Signed)
Emergency Medicine Provider Triage Evaluation Note  Dakota Weber , a 37 y.o. male  was evaluated in triage.  Pt complains of chest pain.  Review of Systems  Positive: Palpitations, lightheaded, chest pain Negative: Nausea, vomiting, diaphoresis, EVAR's, chills  Physical Exam  BP 127/89 (BP Location: Right Arm)   Pulse (!) 57   Temp 98.9 F (37.2 C) (Oral)   Resp 16   Ht 6\' 1"  (1.854 m)   Wt 104.3 kg   SpO2 98%   BMI 30.34 kg/m  Gen:   Awake, no distress   Resp:  Normal effort, lungs CTAB MSK:   Moves extremities without difficulty   Medical Decision Making  Medically screening exam initiated at 1:40 PM.  Appropriate orders placed.  Prathik Magouirk was informed that the remainder of the evaluation will be completed by another provider, this initial triage assessment does not replace that evaluation, and the importance of remaining in the ED until their evaluation is complete.  Patient with history of nonischemic cardiomyopathy and prior episode of ventricular tachycardia s/p ICD placement, presenting for chest discomfort starting today.  He has had intermittent palpitations over the past 3 days.  Today, while at work, patient experienced left-sided chest pressure and right-sided sharp chest pain.  This was not in the setting of exertion.  He does continue to have mild chest discomfort.  He also endorses lightheadedness today.   Godfrey Pick, MD 04/16/22 [phone]

## 2022-04-16 NOTE — Consult Note (Signed)
Cardiology Consultation   Patient ID: Dakota Weber MRN: [id number]; DOB: 01-16-1985  Admit date: 04/16/2022 Date of Consult: 04/17/2022  PCP:  Armanda Heritage, NP   [facility] Cardiologist:  Marina Goodell, MD  Electrophysiologist:  Vickie Epley, MD       Patient Profile:   Dakota Weber is a 37 y.o. male with a hx of NICM HFrEF, VT s/p ICD who is being seen 04/17/2022 for the evaluation of chest pain at the request of the ED.  History of Present Illness:   Dakota Weber NICM HFrEF, VT s/p ICD who is being seen 04/16/2022 for the evaluation of chest pain at the request of the ED.  H/o of intermittent CP where doctors have not been able to figure out what it was. Restarted his home meds ~2 weeks ago after cardiology clinic in 03/26/22. Was feeling ok and then starting last Thursday, started appreciating CP over the left chest for hours. Nothing seems to bring it on and nothing seems to help it go away, it will just spontaneously resolve on its own.  He decided to come into the ED today, last CP was in the waiting room but now it has completely resolved. He states that the CP is at left of center of his chest, sometimes radiates to his R chest, but otherwise will just be a soreness over his L chest. He was just sitting when this episode of CP occurred. He denies SOB or presyncope. Does have occasional palpitations. No angina or exertional intolerance, he walks everywhere and has to walk 2 miles to the pharmacy and 2 miles back to pick up his medications, which he can do without recurrence of his CP.  Past Medical History:  Diagnosis Date   CHF (congestive heart failure)    V-tach     Past Surgical History:  Procedure Laterality Date   AMPUTATION Left 03/20/2012   Procedure: IRRIGATION AND DEBRIDEMENT REVISION AMPUTATION LEFT FINGER;  Surgeon: Tennis Must, MD;  Location: [facility];  Service: Orthopedics;  Laterality: Left;   I & D EXTREMITY Left 03/20/2012    Procedure: IRRIGATION LEFT LONG FINGER;  Surgeon: Tennis Must, MD;  Location: [facility];  Service: Orthopedics;  Laterality: Left;   ICD IMPLANT N/A 01/17/2021   Procedure: ICD IMPLANT;  Surgeon: Evans Lance, MD;  Location: [facility];  Service: Cardiovascular;  Laterality: N/A;   LEAD REVISION/REPAIR N/A 01/18/2021   Procedure: LEAD REVISION/REPAIR;  Surgeon: Evans Lance, MD;  Location: [facility];  Service: Cardiovascular;  Laterality: N/A;     Home Medications:  Prior to Admission medications   Medication Sig Start Date End Date Taking? Authorizing Provider  aspirin EC 81 MG tablet Take 1 tablet (81 mg total) by mouth daily. Swallow whole. 10/31/21   Massengill, Ovid Curd, MD  atorvastatin (LIPITOR) 40 MG tablet Take 40 mg by mouth daily. 01/04/22   [provider]  empagliflozin (JARDIANCE) 10 MG TABS tablet Take 1 tablet (10 mg total) by mouth daily. 01/17/21   Cheryln Manly, NP  metoprolol succinate (TOPROL XL) 25 MG 24 hr tablet Take 1 tablet (25 mg total) by mouth at bedtime. 01/17/22   Mamie Levers, NP  metoprolol succinate (TOPROL-XL) 50 MG 24 hr tablet Take 1 tablet (50 mg total) by mouth daily. 01/17/22   Mamie Levers, NP  Multiple Vitamin (MULTIVITAMIN) capsule Take 1 capsule by mouth daily.    [provider]  sacubitril-valsartan (ENTRESTO) 24-26 MG  Take 1 tablet by mouth 2 (two) times daily. Please hold entresto if systolic blood pressure less than 100. 01/17/22   Mamie Levers, NP    Inpatient Medications: Scheduled Meds:  Continuous Infusions:  PRN Meds:   Allergies:    Allergies  Allergen Reactions   Ciprofloxacin Rash    Social History:   Social History   Socioeconomic History   Marital status: Divorced    Spouse name: Not on file   Number of children: Not on file   Years of education: Not on file   Highest education level: Not on file  Occupational History   Not on file  Tobacco Use   Smoking status: Never    Smokeless tobacco: Never  Vaping Use   Vaping Use: Never used  Substance and Sexual Activity   Alcohol use: No   Drug use: No   Sexual activity: Yes  Other Topics Concern   Not on file  Social History Narrative   Bachelors degree/ apt with wife/ R handed/ occasional caffeine/likes to fish   Social Determinants of Health   Financial Resource Strain: Not on file  Food Insecurity: Patient Declined (10/27/2021)   Hunger Vital Sign    Worried About Running Out of Food in the Last Year: Patient declined    [place] in the Last Year: Patient declined  Transportation Needs: Unmet Transportation Needs (10/27/2021)   PRAPARE - Hydrologist (Medical): Yes    Lack of Transportation (Non-Medical): Yes  Physical Activity: Not on file  Stress: Not on file  Social Connections: Not on file  Intimate Partner Violence: Patient Declined (10/27/2021)   Humiliation, Afraid, Rape, and Kick questionnaire    Fear of Current or Ex-Partner: Patient declined    Emotionally Abused: Patient declined    Physically Abused: Patient declined    Sexually Abused: Patient declined    Family History:   Family History  Problem Relation Age of Onset   Cancer Father     ROS:  Please see the history of present illness.  All other ROS reviewed and negative.     Physical Exam/Data:   Vitals:   04/16/22 1328 04/16/22 1329 04/16/22 1926 04/16/22 2100  BP: 127/89  (!) 139/98 126/85  Pulse: (!) 57  (!) 49 (!) 53  Resp: 16  (!) 21 (!) 22  Temp: 98.9 F (37.2 C)  98.1 F (36.7 C)   TempSrc: Oral  Oral   SpO2: 98%  99% 99%  Weight:  104.3 kg    Height:  6\' 1"  (1.854 m)     No intake or output data in the 24 hours ending 04/17/22 0020    04/16/2022    1:29 PM 03/26/2022    9:20 AM 01/17/2022    8:20 AM  Last 3 Weights  Weight (lbs) 230 lb 236 lb 237 lb 12.8 oz  Weight (kg) 104.327 kg 107.049 kg 107.865 kg     Body mass index is 30.34 kg/m.  General:  Well nourished,  well developed, in no acute distress HEENT: normal Neck: no elevation in JVP Vascular: No carotid bruits; Distal pulses 2+ bilaterally Cardiac:  normal S1, S2; RRR; no murmur, no tenderness to palpitation Lungs:  clear to auscultation bilaterally, no wheezing, rhonchi or rales  Abd: soft, nontender, no hepatomegaly  Ext: no edema Musculoskeletal:  No deformities, BUE and BLE strength normal and equal Skin: warm and dry  Neuro:  Grossly alert and awake, grossly oriented, moving all  extremities spontaneously Psych:  Normal affect   EKG:  The EKG was personally reviewed and demonstrates:  sinus bradycardia at 59bpm, TWI in the inferolateral leads which are present on his prior EKG in 12/2021  Relevant CV Studies:  TTE 01/18/21:  1. No pericardial effusion visualized. RV free wall not well visualized  but based on limited views, there does not appear to be a significant  effusion.   2. Left ventricular ejection fraction, by estimation, is 30 to 35%. The  left ventricle demonstrates global hypokinesis. The left ventricular  internal cavity size was mildly dilated.   3. The mitral valve is normal in structure. Trivial mitral valve  regurgitation.   4. Aortic dilatation noted. There is mild dilatation of the aortic root,  measuring 39 mm.   5. The inferior vena cava is normal in size with greater than 50%  respiratory variability, suggesting right atrial pressure of 3 mmHg.   From [facility] record per his last cardiology outpatient note: -- Cardiac echo from September, 2022 with moderately reduced LV systolic function with EF 35 to 40% as above.  -- Coronary CTA revealed normal coronaries with calcium score 0 from October 2022 as above. -- Cardiac MRI from December 2022 revealed severely reduced LV systolic function with global hypokinesis and LVEF 28%. Dilated cardiomyopathy, myocarditis and sarcoidosis are on the differential as per report. -- PET/CT study was done in January of this year  2023 and no evidence of sarcoidosis was noted.   Laboratory Data:  High Sensitivity Troponin:   Recent Labs  Lab 04/16/22 1338 04/16/22 1635  TROPONINIHS 11 10     Chemistry Recent Labs  Lab 04/16/22 1338  NA 137  K 3.6  CL 98  CO2 29  GLUCOSE 77  BUN 12  CREATININE 1.22  CALCIUM 9.7  MG 2.3  GFRNONAA >60  ANIONGAP 10    Recent Labs  Lab 04/16/22 1338  PROT 7.8  ALBUMIN 4.4  AST 21  ALT 15  ALKPHOS 51  BILITOT 0.9   Lipids No results for input(s): "CHOL", "TRIG", "HDL", "LABVLDL", "LDLCALC", "CHOLHDL" in the last 168 hours.  Hematology Recent Labs  Lab 04/16/22 1338  WBC 7.6  RBC 5.92*  HGB 15.3  HCT 45.9  MCV 77.5*  MCH 25.8*  MCHC 33.3  RDW 14.4  PLT 212   Thyroid No results for input(s): "TSH", "FREET4" in the last 168 hours.  BNPNo results for input(s): "BNP", "PROBNP" in the last 168 hours.  DDimer No results for input(s): "DDIMER" in the last 168 hours.   Radiology/Studies:  DG Chest 2 View  Result Date: 04/16/2022 CLINICAL DATA:  Chest pain EXAM: CHEST - 2 VIEW COMPARISON:  CXR 09/26/21 FINDINGS: Left-sided single lead cardiac device in place with lead in the right ventricle. No pleural effusion. No pneumothorax. No focal airspace opacity. No radiographically apparent displaced rib fracture. Visualized upper abdomen is unremarkable. Vertebral body heights are maintained. IMPRESSION: No radiographic finding to explain chest pain. Electronically Signed   By: Marin Roberts M.D.   On: 04/16/2022 14:13     Assessment and Plan:   37 y.o. male with a hx of NICM HFrEF, VT s/p Biotronik ICD who is being seen 04/16/2022 for the evaluation of chest pain at the request of the ED.  #Chest pain Presents for intermittent left-sided chest pain which does not change with positioning, deep breaths, exertion, eating, or palpitation on the chest surface. Hs-troponins negative 10 -> 11. He otherwise walks miles daily without  angina or exertional intolerance so my  suspicion for coronary ischemia or worsening low-output heart failure is low. The pain comes and goes spontaneously and it is unclear if it is cardiac in nature. We discussed continuing an outpatient workup for his chest pain, but his preference is for admission for continued monitoring and evaluation for chest pain. - Recommend admission to medicine for workup for non-cardiac causes of chest pain  > can consider PPI to empirically treat GI causes or lidocaine patch to treat MSK causes  > consider D dimer or CT PE to workup for PE though suspicion is low with his normal HR and lack of hypoxemia - From a cardiac perspective, we will obtain a TTE in the AM to observe for interval change in his cardiac function - continue on telemetry to assess PVC burden while inpatient; we will discuss need for device interrogation in the AM  #NICM HFrEF s/p Biotronik ICD Continue home metoprolol, entresto, empag, aspirin, and atorvastatin  Risk Assessment/Risk Scores:        New York Heart Association (NYHA) Functional Class NYHA Class II        For questions or updates, please contact [place] Please consult [email or web address] for contact info under    Signed, Pollie Meyer, MD  04/17/2022 12:20 AM

## 2022-04-17 ENCOUNTER — Observation Stay (HOSPITAL_BASED_OUTPATIENT_CLINIC_OR_DEPARTMENT_OTHER): Payer: BLUE CROSS/BLUE SHIELD

## 2022-04-17 DIAGNOSIS — R079 Chest pain, unspecified: Secondary | ICD-10-CM

## 2022-04-17 LAB — BASIC METABOLIC PANEL
Anion gap: 9 (ref 5–15)
BUN: 11 mg/dL (ref 6–20)
CO2: 29 mmol/L (ref 22–32)
Calcium: 9.1 mg/dL (ref 8.9–10.3)
Chloride: 101 mmol/L (ref 98–111)
Creatinine, Ser: 1.29 mg/dL — ABNORMAL HIGH (ref 0.61–1.24)
GFR, Estimated: >60 mL/min (ref >60)
Glucose, Bld: 89 mg/dL (ref 70–99)
Potassium: 4 mmol/L (ref 3.5–5.1)
Sodium: 139 mmol/L (ref 135–145)

## 2022-04-17 LAB — CBC
HCT: 44.8 % (ref 39.0–52.0)
Hemoglobin: 14.9 g/dL (ref 13.0–17.0)
MCH: 25.8 pg — ABNORMAL LOW (ref 26.0–34.0)
MCHC: 33.3 g/dL (ref 30.0–36.0)
MCV: 77.6 fL — ABNORMAL LOW (ref 80.0–100.0)
Platelets: 209 10*3/uL (ref 150–400)
RBC: 5.77 MIL/uL (ref 4.22–5.81)
RDW: 14.2 % (ref 11.5–15.5)
WBC: 8.3 10*3/uL (ref 4.0–10.5)
nRBC: 0 % (ref 0.0–0.2)

## 2022-04-17 LAB — HEMOGLOBIN A1C
Hgb A1c MFr Bld: 5.7 % — ABNORMAL HIGH (ref 4.8–5.6)
Mean Plasma Glucose: 117 mg/dL

## 2022-04-17 LAB — ECHOCARDIOGRAM COMPLETE
AR max vel: 3.75 cm2
AV Peak grad: 4.2 mmHg
Ao pk vel: 1.03 m/s
Area-P 1/2: 3.45 cm2
Height: 73 in
S' Lateral: 5.2 cm
Single Plane A4C EF: 34.3 %
Weight: 3679.04 oz

## 2022-04-17 LAB — LIPID PANEL
Cholesterol: 175 mg/dL (ref 0–200)
HDL: 58 mg/dL (ref >40)
LDL Cholesterol: 107 mg/dL — ABNORMAL HIGH (ref 0–99)
Total CHOL/HDL Ratio: 3 RATIO
Triglycerides: 48 mg/dL (ref <150)
VLDL: 10 mg/dL (ref 0–40)

## 2022-04-17 LAB — MAGNESIUM: Magnesium: 2.2 mg/dL (ref 1.7–2.4)

## 2022-04-17 LAB — PHOSPHORUS: Phosphorus: 4.3 mg/dL (ref 2.5–4.6)

## 2022-04-17 LAB — HIV ANTIBODY (ROUTINE TESTING W REFLEX): HIV Screen 4th Generation wRfx: NONREACTIVE

## 2022-04-17 MED ORDER — ADULT MULTIVITAMIN W/MINERALS CH
1.0000 | ORAL_TABLET | Freq: Every day | ORAL | Status: DC
  Administered 2022-04-17: 1 via ORAL
  Filled 2022-04-17: qty 1

## 2022-04-17 MED ORDER — PROCHLORPERAZINE EDISYLATE 10 MG/2ML IJ SOLN: 5.0000 mg | Freq: Four times a day (QID) | INTRAMUSCULAR | Status: DC | PRN

## 2022-04-17 MED ORDER — METOPROLOL SUCCINATE ER 50 MG PO TB24
50.0000 mg | ORAL_TABLET | Freq: Every day | ORAL | Status: DC
  Administered 2022-04-17: 50 mg via ORAL
  Filled 2022-04-17: qty 1

## 2022-04-17 MED ORDER — MELATONIN 5 MG PO TABS: 5.0000 mg | ORAL_TABLET | Freq: Every evening | ORAL | Status: DC | PRN

## 2022-04-17 MED ORDER — POLYETHYLENE GLYCOL 3350 17 G PO PACK: 17.0000 g | PACK | Freq: Every day | ORAL | Status: DC | PRN

## 2022-04-17 MED ORDER — ACETAMINOPHEN 325 MG PO TABS: 650.0000 mg | ORAL_TABLET | Freq: Four times a day (QID) | ORAL | Status: DC | PRN

## 2022-04-17 MED ORDER — ATORVASTATIN CALCIUM 40 MG PO TABS
40.0000 mg | ORAL_TABLET | Freq: Every day | ORAL | Status: DC
  Administered 2022-04-17: 40 mg via ORAL
  Filled 2022-04-17: qty 1

## 2022-04-17 MED ORDER — OXYCODONE HCL 5 MG PO TABS: 5.0000 mg | ORAL_TABLET | Freq: Four times a day (QID) | ORAL | Status: DC | PRN

## 2022-04-17 MED ORDER — METOPROLOL SUCCINATE ER 25 MG PO TB24
25.0000 mg | ORAL_TABLET | Freq: Every day | ORAL | Status: DC
  Administered 2022-04-17: 25 mg via ORAL
  Filled 2022-04-17: qty 1

## 2022-04-17 MED ORDER — ASPIRIN 81 MG PO TBEC
81.0000 mg | DELAYED_RELEASE_TABLET | Freq: Every day | ORAL | Status: DC
  Administered 2022-04-17: 81 mg via ORAL
  Filled 2022-04-17: qty 1

## 2022-04-17 MED ORDER — HYDROMORPHONE HCL 1 MG/ML IJ SOLN: 0.5000 mg | INTRAMUSCULAR | Status: DC | PRN

## 2022-04-17 NOTE — Discharge Summary (Signed)
Physician Discharge Summary  Dakota Weber [id number] DOB: May 12, 1985 DOA: 04/16/2022  PCP: Armanda Heritage, NP  Admit date: 04/16/2022  Discharge date: 04/17/2022  Admitted From:Home  Disposition:  Home  Recommendations for Outpatient Follow-up:  Follow up with PCP in 1-2 weeks Follow-up with cardiology as scheduled outpatient Continue home medications as prior May take over-the-counter pain medication such as Tylenol to assist with musculoskeletal pain  Home Health: None  Equipment/Devices: None  Discharge Condition:Stable  CODE STATUS: Full  Diet recommendation: Heart Healthy  Brief/Interim Summary: Dakota Weber is a 37 y.o. male with medical history significant for nonischemic cardiomyopathy, HFrEF 30 to 35%, ventricular tachycardia status post ICD placement, who presented to [facility] ED with complaints of nonexertional left-sided chest pain, intermittently.  He was seen by cardiology and this pain appeared atypical.  He had 2D echocardiogram reviewed by cardiology with LVEF similar to prior with no new wall motion abnormalities noted.  It appears that he has musculoskeletal chest pain and has been recommended to take over-the-counter pain medications as needed.  No further significant pain symptoms noted and he is in stable condition for discharge.  Discharge Diagnoses:  Principal Problem:   Chest pain  Principal discharge diagnosis: Atypical chest pain likely musculoskeletal in origin.  Discharge Instructions  Discharge Instructions     Diet - low sodium heart healthy   Complete by: As directed    Increase activity slowly   Complete by: As directed       Allergies as of 04/17/2022       Reactions   Ciprofloxacin Rash        Medication List     TAKE these medications    aspirin EC 81 MG tablet Take 1 tablet (81 mg total) by mouth daily. Swallow whole.   atorvastatin 40 MG tablet Commonly known as: LIPITOR Take 40 mg by mouth daily.   empagliflozin 10  MG Tabs tablet Commonly known as: JARDIANCE Take 1 tablet (10 mg total) by mouth daily.   Entresto 24-26 MG Generic drug: sacubitril-valsartan Take 1 tablet by mouth 2 (two) times daily. Please hold entresto if systolic blood pressure less than 100.   losartan 25 MG tablet Commonly known as: COZAAR Take 25 mg by mouth daily.   metoprolol succinate 50 MG 24 hr tablet Commonly known as: TOPROL-XL Take 1 tablet (50 mg total) by mouth daily. What changed: Another medication with the same name was changed. Make sure you understand how and when to take each.   metoprolol succinate 25 MG 24 hr tablet Commonly known as: Toprol XL Take 1 tablet (25 mg total) by mouth at bedtime. What changed: additional instructions   multivitamin capsule Take 1 capsule by mouth daily.        Follow-up Information     Armanda Heritage, NP. Schedule an appointment as soon as possible for a visit in 1 week(s).   Specialty: Nurse Practitioner Contact information: [place] [address] [phone]                Allergies  Allergen Reactions   Ciprofloxacin Rash    Consultations: Cardiology   Procedures/Studies: DG Chest 2 View  Result Date: 04/16/2022 CLINICAL DATA:  Chest pain EXAM: CHEST - 2 VIEW COMPARISON:  CXR 09/26/21 FINDINGS: Left-sided single lead cardiac device in place with lead in the right ventricle. No pleural effusion. No pneumothorax. No focal airspace opacity. No radiographically apparent displaced rib fracture. Visualized upper abdomen is unremarkable. Vertebral body heights  are maintained. IMPRESSION: No radiographic finding to explain chest pain. Electronically Signed   By: Marin Roberts M.D.   On: 04/16/2022 14:13   CUP PACEART INCLINIC DEVICE CHECK  Result Date: 03/26/2022 ICD check in clinic. Normal device function. Thresholds and sensing consistent with previous device measurements. Impedance trends stable over time. a few brief NSVT  in VT1 zone ar ST/AT. Histogram distribution appropriate for patient and level of activity. No changes made this session. Device programmed at appropriate safety margins. Device programmed to optimize intrinsic conduction. Estimated longevity _BOS__. Pt enrolled in remote follow-up    Discharge Exam: Vitals:   04/17/22 0937 04/17/22 1116  BP:  120/84  Pulse: 63 68  Resp:  17  Temp:  98.4 F (36.9 C)  SpO2:  98%   Vitals:   04/17/22 0805 04/17/22 0933 04/17/22 0937 04/17/22 1116  BP: 117/72 113/78  120/84  Pulse: (!) 56 (!) 57 63 68  Resp: 16 19  17   Temp: 98.4 F (36.9 C)   98.4 F (36.9 C)  TempSrc: Oral   Oral  SpO2: 100% 93%  98%  Weight:      Height:        General: Pt is alert, awake, not in acute distress Cardiovascular: RRR, S1/S2 +, no rubs, no gallops Respiratory: CTA bilaterally, no wheezing, no rhonchi Abdominal: Soft, NT, ND, bowel sounds + Extremities: no edema, no cyanosis    The results of significant diagnostics from this hospitalization (including imaging, microbiology, ancillary and laboratory) are listed below for reference.     Microbiology: No results found for this or any previous visit (from the past 240 hour(s)).   Labs: BNP (last 3 results) Recent Labs    09/26/21 1452  BNP [id number]   Basic Metabolic Panel: Recent Labs  Lab 04/16/22 1338 04/17/22 0604  NA 137 139  K 3.6 4.0  CL 98 101  CO2 29 29  GLUCOSE 77 89  BUN 12 11  CREATININE 1.22 1.29*  CALCIUM 9.7 9.1  MG 2.3 2.2  PHOS  --  4.3   Liver Function Tests: Recent Labs  Lab 04/16/22 1338  AST 21  ALT 15  ALKPHOS 51  BILITOT 0.9  PROT 7.8  ALBUMIN 4.4   Recent Labs  Lab 04/16/22 1338  LIPASE 32   No results for input(s): "AMMONIA" in the last 168 hours. CBC: Recent Labs  Lab 04/16/22 1338 04/17/22 0604  WBC 7.6 8.3  HGB 15.3 14.9  HCT 45.9 44.8  MCV 77.5* 77.6*  PLT 212 209   Cardiac Enzymes: No results for input(s): "CKTOTAL", "CKMB", "CKMBINDEX",  "TROPONINI" in the last 168 hours. BNP: Invalid input(s): "POCBNP" CBG: No results for input(s): "GLUCAP" in the last 168 hours. D-Dimer No results for input(s): "DDIMER" in the last 72 hours. Hgb A1c No results for input(s): "HGBA1C" in the last 72 hours. Lipid Profile Recent Labs    04/17/22 0159  CHOL 175  HDL 58  LDLCALC 107*  TRIG 48  CHOLHDL 3.0   Thyroid function studies No results for input(s): "TSH", "T4TOTAL", "T3FREE", "THYROIDAB" in the last 72 hours.  Invalid input(s): "FREET3" Anemia work up No results for input(s): "VITAMINB12", "FOLATE", "FERRITIN", "TIBC", "IRON", "RETICCTPCT" in the last 72 hours. Urinalysis    Component Value Date/Time   COLORURINE YELLOW 12/28/2021 2043   APPEARANCEUR CLEAR 12/28/2021 2043   LABSPEC 1.027 12/28/2021 2043   PHURINE 5.0 12/28/2021 2043   GLUCOSEU >=500 (A) 12/28/2021 2043   [place] NEGATIVE 12/28/2021 2043  [place] NEGATIVE 12/28/2021 2043   [place] NEGATIVE 12/28/2021 2043   PROTEINUR NEGATIVE 12/28/2021 2043   NITRITE NEGATIVE 12/28/2021 2043   LEUKOCYTESUR NEGATIVE 12/28/2021 2043   Sepsis Labs Recent Labs  Lab 04/16/22 1338 04/17/22 0604  WBC 7.6 8.3   Microbiology No results found for this or any previous visit (from the past 240 hour(s)).   Time coordinating discharge: 35 minutes  SIGNED:   Rodena Goldmann, DO [facility] 04/17/2022, 11:36 AM  If 7PM-7AM, please contact night-coverage [email or web address]

## 2022-04-17 NOTE — H&P (Signed)
History and Physical  Dakota Weber [id number] DOB: 1985/02/24 DOA: 04/16/2022  Referring physician: Consuella Lose  PCP: Armanda Heritage, NP  Outpatient Specialists: Cardiology Patient coming from: Home.  Chief Complaint: Chest pain  HPI: Dakota Weber is a 37 y.o. male with medical history significant for nonischemic cardiomyopathy, HFrEF 30 to 35%, ventricular tachycardia status post ICD placement, who presented to [facility] ED with complaints of nonexertional left-sided chest pain, intermittently.  Feels like soreness.  No improving or exacerbating factors.  Presented to the ED for further evaluation.  In the ED, high-sensitivity troponin negative x 2.  Twelve-lead EKG  with no evidence of acute ischemia.  Chest x-ray nonacute.  Due to significant cardiac history, cardiology was consulted by EDP.  Recommended medicine admission for observation and 2D echo.  Cardiology will continue to see in consultation.  At the time of this visit, the patient's chest pain had resolved.  The patient was admitted by [facility], hospitalist service.  ED Course: Tmax 98.9.  BP 130/90, pulse 57, respiratory rate 20, saturation 99% on room air.  CBC essentially unremarkable, except MCV 77.  CMP is/unremarkable.  Review of Systems: Review of systems as noted in the HPI. All other systems reviewed and are negative.   Past Medical History:  Diagnosis Date   CHF (congestive heart failure)    V-tach    Past Surgical History:  Procedure Laterality Date   AMPUTATION Left 03/20/2012   Procedure: IRRIGATION AND DEBRIDEMENT REVISION AMPUTATION LEFT FINGER;  Surgeon: Tennis Must, MD;  Location: [place];  Service: Orthopedics;  Laterality: Left;   I & D EXTREMITY Left 03/20/2012   Procedure: IRRIGATION LEFT LONG FINGER;  Surgeon: Tennis Must, MD;  Location: [place];  Service: Orthopedics;  Laterality: Left;   ICD IMPLANT N/A 01/17/2021   Procedure: ICD IMPLANT;  Surgeon: Evans Lance, MD;  Location: [place] [facility];   Service: Cardiovascular;  Laterality: N/A;   LEAD REVISION/REPAIR N/A 01/18/2021   Procedure: LEAD REVISION/REPAIR;  Surgeon: Evans Lance, MD;  Location: [place] [facility];  Service: Cardiovascular;  Laterality: N/A;    Social History:  reports that he has never smoked. He has never used smokeless tobacco. He reports that he does not drink alcohol and does not use drugs.   Allergies  Allergen Reactions   Azithromycin Rash    Pt unsure    Ciprofloxacin Rash    Family History  Problem Relation Age of Onset   Cancer Father     Maternal uncle with history of cardiac arrest in his 43s.  Prior to Admission medications   Medication Sig Start Date End Date Taking? Authorizing Provider  aspirin EC 81 MG tablet Take 1 tablet (81 mg total) by mouth daily. Swallow whole. 10/31/21   Massengill, Ovid Curd, MD  atorvastatin (LIPITOR) 40 MG tablet Take 40 mg by mouth daily. 01/04/22   [provider]  empagliflozin (JARDIANCE) 10 MG TABS tablet Take 1 tablet (10 mg total) by mouth daily. 01/17/21   Cheryln Manly, NP  metoprolol succinate (TOPROL XL) 25 MG 24 hr tablet Take 1 tablet (25 mg total) by mouth at bedtime. 01/17/22   Mamie Levers, NP  metoprolol succinate (TOPROL-XL) 50 MG 24 hr tablet Take 1 tablet (50 mg total) by mouth daily. 01/17/22   Mamie Levers, NP  Multiple Vitamin (MULTIVITAMIN) capsule Take 1 capsule by mouth daily.    [provider]  sacubitril-valsartan (ENTRESTO) 24-26 MG Take 1 tablet by mouth 2 (two) times  daily. Please hold entresto if systolic blood pressure less than 100. 01/17/22   Mamie Levers, NP    Physical Exam: BP 126/85   Pulse (!) 53   Temp 98.1 F (36.7 C) (Oral)   Resp (!) 22   Ht 6\' 1"  (1.854 m)   Wt 104.3 kg   SpO2 99%   BMI 30.34 kg/m   General: 37 y.o. year-old male well developed well nourished in no acute distress.  Alert and oriented x3. Cardiovascular: Regular rate and rhythm with no rubs or gallops.  No thyromegaly or  JVD noted.  No lower extremity edema. 2/4 pulses in all 4 extremities. Respiratory: Clear to auscultation with no wheezes or rales. Good inspiratory effort. Abdomen: Soft nontender nondistended with normal bowel sounds x4 quadrants. Muskuloskeletal: No cyanosis, clubbing or edema noted bilaterally Neuro: CN II-XII intact, strength, sensation, reflexes Skin: No ulcerative lesions noted or rashes Psychiatry: Judgement and insight appear normal. Mood is appropriate for condition and setting          Labs on Admission:  Basic Metabolic Panel: Recent Labs  Lab 04/16/22 1338  NA 137  K 3.6  CL 98  CO2 29  GLUCOSE 77  BUN 12  CREATININE 1.22  CALCIUM 9.7  MG 2.3   Liver Function Tests: Recent Labs  Lab 04/16/22 1338  AST 21  ALT 15  ALKPHOS 51  BILITOT 0.9  PROT 7.8  ALBUMIN 4.4   Recent Labs  Lab 04/16/22 1338  LIPASE 32   No results for input(s): "AMMONIA" in the last 168 hours. CBC: Recent Labs  Lab 04/16/22 1338  WBC 7.6  HGB 15.3  HCT 45.9  MCV 77.5*  PLT 212   Cardiac Enzymes: No results for input(s): "CKTOTAL", "CKMB", "CKMBINDEX", "TROPONINI" in the last 168 hours.  BNP (last 3 results) Recent Labs    09/26/21 1452  BNP 18.7    ProBNP (last 3 results) No results for input(s): "PROBNP" in the last 8760 hours.  CBG: No results for input(s): "GLUCAP" in the last 168 hours.  Radiological Exams on Admission: DG Chest 2 View  Result Date: 04/16/2022 CLINICAL DATA:  Chest pain EXAM: CHEST - 2 VIEW COMPARISON:  CXR 09/26/21 FINDINGS: Left-sided single lead cardiac device in place with lead in the right ventricle. No pleural effusion. No pneumothorax. No focal airspace opacity. No radiographically apparent displaced rib fracture. Visualized upper abdomen is unremarkable. Vertebral body heights are maintained. IMPRESSION: No radiographic finding to explain chest pain. Electronically Signed   By: Marin Roberts M.D.   On: 04/16/2022 14:13    EKG: I  independently viewed the EKG done and my findings are as followed:    Assessment/Plan Present on Admission:  Chest pain  Principal Problem:   Chest pain  Atypical chest pain Seen by cardiology High-sensitivity troponin negative x 2, no evidence of acute ischemia on 20 EKG, chest x-ray nonacute. 2D echo pending, last lipid panel, A1c Monitor on telemetry Management per cardiology  Hypertension Resume home regimen Monitor vital signs  Hyperlipidemia Resume home regimen.  Nonischemic cardiomyopathy HFrEF Resume home regimen Start strict I's and O's and daily weight   DVT prophylaxis: SCDs, the patient is ambulatory  Code Status: Full code  Family Communication: None at bedside  Disposition Plan: Admitted to telemetry cardiac unit  Consults called: Cardiology consulted.  Admission status: Observation status.   Status is: Observation    Kayleen Memos MD [facility] Pager [phone]  If 7PM-7AM, please contact night-coverage [email or web address] Password  [email or web address]  04/17/2022, 12:19 AM

## 2022-04-17 NOTE — Progress Notes (Signed)
Rounding Note    Patient Name: Dakota Weber Date of Encounter: 04/17/2022  [place] Cardiologist: Marina Goodell, MD   Subjective   Feels well this AM, getting his echo  Inpatient Medications    Scheduled Meds:  aspirin EC  81 mg Oral Daily   atorvastatin  40 mg Oral Daily   metoprolol succinate  25 mg Oral QHS   metoprolol succinate  50 mg Oral Daily   multivitamin with minerals  1 tablet Oral Daily   Continuous Infusions:  PRN Meds: acetaminophen, HYDROmorphone (DILAUDID) injection, melatonin, oxyCODONE, polyethylene glycol, prochlorperazine   Vital Signs    Vitals:   04/17/22 0600 04/17/22 0805 04/17/22 0933 04/17/22 0937  BP: 110/70 117/72 113/78   Pulse: (!) 53 (!) 56 (!) 57 63  Resp: 17 16 19    Temp: 97.8 F (36.6 C) 98.4 F (36.9 C)    TempSrc: Oral Oral    SpO2: 98% 100% 93%   Weight:      Height:       No intake or output data in the 24 hours ending 04/17/22 1032    04/17/2022    5:00 AM 04/16/2022    1:29 PM 03/26/2022    9:20 AM  Last 3 Weights  Weight (lbs) 229 lb 15 oz 230 lb 236 lb  Weight (kg) 104.3 kg 104.327 kg 107.049 kg      Telemetry    NSR - Personally Reviewed  ECG    No new - Personally Reviewed  Physical Exam   Vitals:   04/17/22 0933 04/17/22 0937  BP: 113/78   Pulse: (!) 57 63  Resp: 19   Temp:    SpO2: 93%     GEN: No acute distress.   Neck: No JVD Cardiac: RRR, no murmurs, rubs, or gallops.  Respiratory: Clear to auscultation bilaterally. GI: Soft, nontender, non-distended  MS: No edema; No deformity. Neuro:  Nonfocal  Psych: Normal affect   Labs    High Sensitivity Troponin:   Recent Labs  Lab 04/16/22 1338 04/16/22 1635  TROPONINIHS 11 10     Chemistry Recent Labs  Lab 04/16/22 1338 04/17/22 0604  NA 137 139  K 3.6 4.0  CL 98 101  CO2 29 29  GLUCOSE 77 89  BUN 12 11  CREATININE 1.22 1.29*  CALCIUM 9.7 9.1  MG 2.3 2.2  PROT 7.8  --   ALBUMIN 4.4  --   AST 21  --   ALT  15  --   ALKPHOS 51  --   BILITOT 0.9  --   GFRNONAA >60 >60  ANIONGAP 10 9    Lipids  Recent Labs  Lab 04/17/22 0159  CHOL 175  TRIG 48  HDL 58  LDLCALC 107*  CHOLHDL 3.0    Hematology Recent Labs  Lab 04/16/22 1338 04/17/22 0604  WBC 7.6 8.3  RBC 5.92* 5.77  HGB 15.3 14.9  HCT 45.9 44.8  MCV 77.5* 77.6*  MCH 25.8* 25.8*  MCHC 33.3 33.3  RDW 14.4 14.2  PLT 212 209   Thyroid No results for input(s): "TSH", "FREET4" in the last 168 hours.  BNPNo results for input(s): "BNP", "PROBNP" in the last 168 hours.  DDimer No results for input(s): "DDIMER" in the last 168 hours.   Radiology    DG Chest 2 View  Result Date: 04/16/2022 CLINICAL DATA:  Chest pain EXAM: CHEST - 2 VIEW COMPARISON:  CXR 09/26/21 FINDINGS: Left-sided single lead cardiac device in place with  lead in the right ventricle. No pleural effusion. No pneumothorax. No focal airspace opacity. No radiographically apparent displaced rib fracture. Visualized upper abdomen is unremarkable. Vertebral body heights are maintained. IMPRESSION: No radiographic finding to explain chest pain. Electronically Signed   By: Marin Roberts M.D.   On: 04/16/2022 14:13    Cardiac Studies   TTE 01/18/21:  1. No pericardial effusion visualized. RV free wall not well visualized  but based on limited views, there does not appear to be a significant  effusion.   2. Left ventricular ejection fraction, by estimation, is 30 to 35%. The  left ventricle demonstrates global hypokinesis. The left ventricular  internal cavity size was mildly dilated.   3. The mitral valve is normal in structure. Trivial mitral valve  regurgitation.   4. Aortic dilatation noted. There is mild dilatation of the aortic root,  measuring 39 mm.   5. The inferior vena cava is normal in size with greater than 50%  respiratory variability, suggesting right atrial pressure of 3 mmHg.   From [facility] record per his last cardiology outpatient note: -- Cardiac echo  from September, 2022 with moderately reduced LV systolic function with EF 35 to 40% as above.  -- Coronary CTA revealed normal coronaries with calcium score 0 from October 2022 as above. -- Cardiac MRI from December 2022 revealed severely reduced LV systolic function with global hypokinesis and LVEF 28%. Dilated cardiomyopathy, myocarditis and sarcoidosis are on the differential as per report. -- PET/CT study was done in January of this year 2023 and no evidence of sarcoidosis was noted.     Patient Profile     Dakota Weber is a 37 y.o. male with a hx of NICM HFrEF, VT s/p Biotronik ICD who is being seen 04/17/2022 for the evaluation of chest pain at the request of the ED.   Assessment & Plan    Chest pain NICM EF 30-35% troponin is negative. EKG does not show ischemic ST changes. Symptoms were atypical. EF still remains severely reduced. No specific wall motion abnormality. He is euvolemic. He had negative prior CAC in 2022. No recommendations for changes in his home medications. Continue current GDMT. Suspect this was MSK related. Recommended he can take tylenol. He can be discharged from a cardiology perspective.   For questions or updates, please contact [place] Please consult [email or web address] for contact info under        Signed, Janina Mayo, MD  04/17/2022, 10:32 AM

## 2022-04-17 NOTE — ED Notes (Signed)
ED TO INPATIENT HANDOFF REPORT  ED Nurse Name and Phone #: Rip Harbour RN [phone]  S Name/Age/Gender Dakota Weber 37 y.o. male Room/Bed: 024C/024C  Code Status   Code Status: Full Code  Home/SNF/Other Home Patient oriented to: self, place, time, and situation Is this baseline? Yes   Triage Complete: Triage complete  Chief Complaint Chest pain [R07.9]  Triage Note Per [id number] pt coming from work- states Thursday starting having PVCs and has progressed over the past few days. At work today started feeling light headed.    Allergies Allergies  Allergen Reactions   Ciprofloxacin Rash    Level of Care/Admitting Diagnosis ED Disposition     ED Disposition  Admit   Condition  --   Comment  Hospital Area: [place] [[id number]]  Level of Care: Telemetry Cardiac [103]  May place patient in observation at [facility] or [place] if equivalent level of care is available:: No  Covid Evaluation: Asymptomatic - no recent exposure (last 10 days) testing not required  Diagnosis: Chest pain [id number]  Admitting Physician: Kayleen Memos [id number]  Attending Physician: Kayleen Memos [id number]          B Medical/Surgery History Past Medical History:  Diagnosis Date   CHF (congestive heart failure)    V-tach    Past Surgical History:  Procedure Laterality Date   AMPUTATION Left 03/20/2012   Procedure: IRRIGATION AND DEBRIDEMENT REVISION AMPUTATION LEFT FINGER;  Surgeon: Tennis Must, MD;  Location: [place];  Service: Orthopedics;  Laterality: Left;   I & D EXTREMITY Left 03/20/2012   Procedure: IRRIGATION LEFT LONG FINGER;  Surgeon: Tennis Must, MD;  Location: [place];  Service: Orthopedics;  Laterality: Left;   ICD IMPLANT N/A 01/17/2021   Procedure: ICD IMPLANT;  Surgeon: Evans Lance, MD;  Location: [place] [facility];  Service: Cardiovascular;  Laterality: N/A;   LEAD REVISION/REPAIR N/A 01/18/2021   Procedure: LEAD REVISION/REPAIR;  Surgeon: Evans Lance, MD;  Location: [place] [facility];  Service: Cardiovascular;  Laterality: N/A;     A IV Location/Drains/Wounds Patient Lines/Drains/Airways Status     Active Line/Drains/Airways     Name Placement date Placement time Site Days   Peripheral IV 04/16/22 Left Antecubital 04/16/22  1344  Antecubital  1   Incision 03/20/12 Hand Left 03/20/12  1327  -- 3680   Incision (Closed) 01/18/21 Chest Lateral;Left;Upper 01/18/21  1250  -- 454            Intake/Output Last 24 hours No intake or output data in the 24 hours ending 04/17/22 0054  Labs/Imaging Results for orders placed or performed during the hospital encounter of 04/16/22 (from the past 48 hour(s))  Basic metabolic panel     Status: None   Collection Time: 04/16/22  1:38 PM  Result Value Ref Range   Sodium 137 135 - 145 mmol/L   Potassium 3.6 3.5 - 5.1 mmol/L   Chloride 98 98 - 111 mmol/L   CO2 29 22 - 32 mmol/L   Glucose, Bld 77 70 - 99 mg/dL    Comment: Glucose reference range applies only to samples taken after fasting for at least 8 hours.   BUN 12 6 - 20 mg/dL   Creatinine, Ser 1.22 0.61 - 1.24 mg/dL   Calcium 9.7 8.9 - 10.3 mg/dL   GFR, Estimated >60 >60 mL/min    Comment: (NOTE) Calculated using the CKD-EPI Creatinine Equation (2021)    Anion gap 10 5 -  15    Comment: Performed at [facility], [place] [address] [place], [place] [address]  CBC     Status: Abnormal   Collection Time: 04/16/22  1:38 PM  Result Value Ref Range   WBC 7.6 4.0 - 10.5 K/uL   RBC 5.92 (H) 4.22 - 5.81 MIL/uL   Hemoglobin 15.3 13.0 - 17.0 g/dL   HCT 45.9 39.0 - 52.0 %   MCV 77.5 (L) 80.0 - 100.0 fL   MCH 25.8 (L) 26.0 - 34.0 pg   MCHC 33.3 30.0 - 36.0 g/dL   RDW 14.4 11.5 - 15.5 %   Platelets 212 150 - 400 K/uL   nRBC 0.0 0.0 - 0.2 %    Comment: Performed at [facility], [place] [address], [place], [place] [address]  Troponin I (High Sensitivity)     Status: None   Collection Time: 04/16/22  1:38 PM  Result Value Ref  Range   Troponin I (High Sensitivity) 11 <18 ng/L    Comment: (NOTE) Elevated high sensitivity troponin I (hsTnI) values and significant  changes across serial measurements may suggest ACS but many other  chronic and acute conditions are known to elevate hsTnI results.  Refer to the "Links" section for chest pain algorithms and additional  guidance. Performed at [facility], [place] [address], [place], [place] [address]   Hepatic function panel     Status: None   Collection Time: 04/16/22  1:38 PM  Result Value Ref Range   Total Protein 7.8 6.5 - 8.1 g/dL   Albumin 4.4 3.5 - 5.0 g/dL   AST 21 15 - 41 U/L   ALT 15 0 - 44 U/L   Alkaline Phosphatase 51 38 - 126 U/L   Total Bilirubin 0.9 0.3 - 1.2 mg/dL   Bilirubin, Direct 0.1 0.0 - 0.2 mg/dL   Indirect Bilirubin 0.8 0.3 - 0.9 mg/dL    Comment: Performed at [facility] [address], [place], [place] [address]  Magnesium     Status: None   Collection Time: 04/16/22  1:38 PM  Result Value Ref Range   Magnesium 2.3 1.7 - 2.4 mg/dL    Comment: Performed at [facility] [address] [place], [place] [address]  Lipase, blood     Status: None   Collection Time: 04/16/22  1:38 PM  Result Value Ref Range   Lipase 32 11 - 51 U/L    Comment: Performed at [facility] [address] [place], [place] [address]  Troponin I (High Sensitivity)     Status: None   Collection Time: 04/16/22  4:35 PM  Result Value Ref Range   Troponin I (High Sensitivity) 10 <18 ng/L    Comment: (NOTE) Elevated high sensitivity troponin I (hsTnI) values and significant  changes across serial measurements may suggest ACS but many other  chronic and acute conditions are known to elevate hsTnI results.  Refer to the "Links" section for chest pain algorithms and additional  guidance. Performed at [facility], [place] [address] [place],  [address]    DG Chest 2 View  Result Date: 04/16/2022 CLINICAL DATA:  Chest pain  EXAM: CHEST - 2 VIEW COMPARISON:  CXR 09/26/21 FINDINGS: Left-sided single lead cardiac device in place with lead in the right ventricle. No pleural effusion. No pneumothorax. No focal airspace opacity. No radiographically apparent displaced rib fracture. Visualized upper abdomen is unremarkable. Vertebral body heights are maintained.  IMPRESSION: No radiographic finding to explain chest pain. Electronically Signed   By: Marin Roberts M.D.   On: 04/16/2022 14:13    Pending Labs Unresulted Labs (From admission, onward)     Start     Ordered   04/17/22 0500  Lipid panel  Tomorrow morning,   R        04/17/22 0034   04/17/22 0500  Hemoglobin A1c  Tomorrow morning,   R        04/17/22 0034   04/17/22 0033  HIV Antibody (routine testing w rflx)  (HIV Antibody (Routine testing w reflex) panel)  Once,   R        04/17/22 0033            Vitals/Pain Today's Vitals   04/16/22 1329 04/16/22 1926 04/16/22 2100 04/17/22 0026  BP:  (!) 139/98 126/85 124/85  Pulse:  (!) 49 (!) 53 68  Resp:  (!) 21 (!) 22 18  Temp:  98.1 F (36.7 C)  97.8 F (36.6 C)  TempSrc:  Oral  Oral  SpO2:  99% 99% 98%  Weight: 104.3 kg     Height: 6\' 1"  (1.854 m)     PainSc:    0-No pain    Isolation Precautions No active isolations  Medications Medications  acetaminophen (TYLENOL) tablet 650 mg (has no administration in time range)  HYDROmorphone (DILAUDID) injection 0.5 mg (has no administration in time range)  oxyCODONE (Oxy IR/ROXICODONE) immediate release tablet 5 mg (has no administration in time range)  polyethylene glycol (MIRALAX / GLYCOLAX) packet 17 g (has no administration in time range)  melatonin tablet 5 mg (has no administration in time range)  prochlorperazine (COMPAZINE) injection 5 mg (has no administration in time range)  aspirin EC tablet 81 mg (has no administration in time range)  atorvastatin (LIPITOR) tablet 40 mg (has no administration in time range)  metoprolol succinate (TOPROL-XL) 24  hr tablet 12.5 mg (has no administration in time range)  metoprolol succinate (TOPROL-XL) 24 hr tablet 50 mg (has no administration in time range)  multivitamin capsule 1 capsule (has no administration in time range)    Mobility walks     Focused Assessments Cardiac Assessment Handoff:  Cardiac Rhythm: Sinus bradycardia No results found for: "CKTOTAL", "CKMB", "CKMBINDEX", "TROPONINI" Lab Results  Component Value Date   DDIMER <0.27 07/14/2020   Does the Patient currently have chest pain? No   , Neuro Assessment Handoff:  Swallow screen pass? Yes  Cardiac Rhythm: Sinus bradycardia       Neuro Assessment:   Neuro Checks:      Has TPA been given? No If patient is a Neuro Trauma and patient is going to OR before floor call report to [place] nurse: [phone] or [phone]   R Recommendations: See Admitting Provider Note  Report given to:   Additional Notes: Pt A&Ox4. Currently denies pain. Continent and ambulatory.

## 2022-04-17 NOTE — Progress Notes (Signed)
  Echocardiogram 2D Echocardiogram has been performed.  Dakota Weber 04/17/2022, 10:42 AM

## 2022-04-17 NOTE — Plan of Care (Signed)
°  Problem: Coping: °Goal: Level of anxiety will decrease °Outcome: Progressing °  °

## 2022-04-20 ENCOUNTER — Ambulatory Visit (INDEPENDENT_AMBULATORY_CARE_PROVIDER_SITE_OTHER): Payer: BLUE CROSS/BLUE SHIELD

## 2022-04-20 DIAGNOSIS — I428 Other cardiomyopathies: Secondary | ICD-10-CM | POA: Diagnosis not present

## 2022-04-20 LAB — CUP PACEART REMOTE DEVICE CHECK
Date Time Interrogation Session: 20240405074458
Implantable Lead Connection Status: 753985
Implantable Lead Implant Date: 20230103
Implantable Lead Location: 753860
Implantable Lead Model: 436909
Implantable Lead Serial Number: 81418533
Implantable Pulse Generator Implant Date: 20230103
Pulse Gen Model: 429525
Pulse Gen Serial Number: 84879015

## 2022-05-22 NOTE — Progress Notes (Signed)
Remote ICD transmission.   

## 2022-06-19 ENCOUNTER — Telehealth: Payer: Self-pay

## 2022-06-19 NOTE — Telephone Encounter (Signed)
B[employer] alert received for HVR event 06/17/2021 @ 16:03 PM, unknown duration. No alerts noted today. Patient was not aware of elevated rates. Compliant with meds. Patient has an apple watch and able to check his heart rate ~ currently 76 bpm. Advised if he has any symptoms to please call. Pt voiced understanding.

## 2022-07-04 ENCOUNTER — Telehealth: Payer: Self-pay

## 2022-07-04 NOTE — Telephone Encounter (Signed)
Alert received from [employer]:  Slow NSVT- 29 beats.  Pt with recent contact for another HVR event.  Pt states compliant with current medication regimen.  This event at 1:19 am.  Will forward to physician for review and any recommendations.   Pt has scheduled follow up with AT 08/01/2022.

## 2022-07-09 MED ORDER — METOPROLOL SUCCINATE ER 50 MG PO TB24: 50.0000 mg | ORAL_TABLET | Freq: Two times a day (BID) | ORAL | 3 refills | Status: AC

## 2022-07-09 NOTE — Telephone Encounter (Signed)
Patient reports that he has been taking Metoprolol 50 mg in the morning and 25 mg at night. Advised him to increase to 50 mg twice daily. He will keep his follow up appointment with Mardelle Matte in July.

## 2022-07-20 ENCOUNTER — Ambulatory Visit: Payer: Self-pay

## 2022-07-20 DIAGNOSIS — I428 Other cardiomyopathies: Secondary | ICD-10-CM

## 2022-07-20 LAB — CUP PACEART REMOTE DEVICE CHECK
Battery Voltage: 3.1 V
Brady Statistic RV Percent Paced: 0 %
Date Time Interrogation Session: 20240705091326
HighPow Impedance: 72 Ohm
Implantable Lead Connection Status: 753985
Implantable Lead Implant Date: 20230103
Implantable Lead Location: 753860
Implantable Lead Model: 436909
Implantable Lead Serial Number: 81418533
Implantable Pulse Generator Implant Date: 20230103
Lead Channel Impedance Value: 423 Ohm
Lead Channel Pacing Threshold Amplitude: 1.1 V
Lead Channel Pacing Threshold Pulse Width: 0.4 ms
Lead Channel Sensing Intrinsic Amplitude: 10.3 mV
Lead Channel Sensing Intrinsic Amplitude: 8 mV
Lead Channel Setting Pacing Amplitude: 2 V
Lead Channel Setting Pacing Pulse Width: 0.4 ms
Lead Channel Setting Sensing Sensitivity: 0.8 mV
Pulse Gen Model: 429525
Pulse Gen Serial Number: 84879015

## 2022-07-25 ENCOUNTER — Encounter: Payer: BLUE CROSS/BLUE SHIELD | Admitting: Physician Assistant

## 2022-07-31 NOTE — Progress Notes (Unsigned)
Electrophysiology Office Note:   Date:  08/01/2022  ID:  Dakota Weber, DOB Jan 30, 1985, MRN [id number]  Primary Cardiologist: Wille Glaser, MD Electrophysiologist: Lanier Prude, MD      History of Present Illness:   Dakota Weber is a 37 y.o. 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   Last seen in EP Clinic 03/2022 - at that time reported his lips "feel funny" on Entresto and had self reduced dose, held for two days with improvement and subsequently restarted.   Admitted 4/1-04/17/22 with non-exertional chest pain. No new wall motion abnormalities identified.  Pain felt musculoskeletal in nature.     Repeat ECHO at [facility] showed an LVEF of 40-42% (06/08/22).  Device alert 06/18/22 with elevated rate (pt asymptomatic) consistent with SVT. Additional device alert 07/04/22 for HVR event at 0119 consistent with 29 beats VT. He reported at that time taking metoprolol 50mg  in am, 25mg  at bedtime.   Since last being seen in our clinic the patient reports doing well. He continues to work out at [employer]. Reports he walked to the appt this am. He tries to stay in HR Zone 1,2,3 from his Apple watch. Reports compliance with meds, weight is stable (keeps meticulous records in his phone).   He denies chest pain, palpitations, dyspnea, PND, orthopnea, nausea, vomiting, dizziness, syncope, edema, weight gain, or early satiety.   Review of systems complete and found to be negative unless listed in HPI.    EP Information / Studies Reviewed:    EKG is ordered today. Personal review as below.  EKG Interpretation Date/Time:  Wednesday August 01 2022 09:33:43 EDT Ventricular Rate:  74 PR Interval:  156 QRS Duration:  102 QT Interval:  384 QTC Calculation: 426 R Axis:   261  Text Interpretation: Sinus rhythm with frequent and consecutive Premature ventricular complexes , fusion  beat When compared with ECG of 16-Apr-2022 19:28, PREVIOUS ECG IS PRESENT Confirmed by Canary Brim ([id number]) on 08/01/2022 9:39:33 AM   ICD Interrogation-  reviewed in detail today,  See PACEART report.  Device History: Biotronik Single Chamber ICD implanted 01/17/21 for HFrEF, syncope, and strong family hx of SCD.  POD1 had pleuritic chest pain and lead was revised. VDD lead.  History of appropriate therapy: No. Inappropriate for SVT.  History of AAD therapy: No   Studies:  PET 01/2021 > negative for sarcoid  ECHO 04/2022 > LVEF 25-30%, LV severely decreased function & global hypokinesis, LV mildly dilated, G2 DD, RV systolic function normal       Physical Exam:   VS:  BP 118/80   Pulse 60   Ht 6\' 1"  (1.854 m)   Wt 237 lb 3.2 oz (107.6 kg)   SpO2 97%   BMI 31.29 kg/m    Wt Readings from Last 3 Encounters:  08/01/22 237 lb 3.2 oz (107.6 kg)  04/17/22 229 lb 15 oz (104.3 kg)  03/26/22 236 lb (107 kg)     GEN: Well nourished, well developed in no acute distress NECK: No JVD; No carotid bruits CARDIAC: Regular rate and rhythm, no murmurs, rubs, gallops RESPIRATORY:  Clear to auscultation without rales, wheezing or rhonchi  ABDOMEN: Soft, non-tender, non-distended EXTREMITIES:  No edema; No deformity   ASSESSMENT AND PLAN:    Chronic Systolic Dysfunction s/p Biotronik single chamber ICD  NICM -euvolemic on exam, weight stable  -continue jardiance, cozaar, Toprol 50  BID  -Stable on an appropriate medical regimen -Normal ICD function -See Pace Art report -No changes today  SVT on Remote  -toprol   NSVT on Remote  -continue Toprol 50 BID  -brief episode, 29 beats, self terminated  -labs with primary Cardiology at [facility] next month   Disposition:   Follow up with Dr. Lalla Brothers in 6 months   Signed, Canary Brim, MSN, APRN, NP-C, AGACNP-BC [facility] - Electrophysiology  08/01/2022, 10:14 AM

## 2022-08-01 ENCOUNTER — Ambulatory Visit: Payer: BLUE CROSS/BLUE SHIELD | Attending: Physician Assistant | Admitting: Pulmonary Disease

## 2022-08-01 ENCOUNTER — Encounter: Payer: Self-pay | Admitting: Student

## 2022-08-01 VITALS — BP 118/80 | HR 60 | Ht 73.0 in | Wt 237.2 lb

## 2022-08-01 DIAGNOSIS — I428 Other cardiomyopathies: Secondary | ICD-10-CM | POA: Diagnosis not present

## 2022-08-01 DIAGNOSIS — I493 Ventricular premature depolarization: Secondary | ICD-10-CM

## 2022-08-01 DIAGNOSIS — Z95810 Presence of automatic (implantable) cardiac defibrillator: Secondary | ICD-10-CM

## 2022-08-01 DIAGNOSIS — I10 Essential (primary) hypertension: Secondary | ICD-10-CM

## 2022-08-01 DIAGNOSIS — I4729 Other ventricular tachycardia: Secondary | ICD-10-CM | POA: Diagnosis not present

## 2022-08-01 DIAGNOSIS — I5022 Chronic systolic (congestive) heart failure: Secondary | ICD-10-CM | POA: Diagnosis not present

## 2022-08-01 LAB — CUP PACEART INCLINIC DEVICE CHECK
Date Time Interrogation Session: 20240717124716
Implantable Lead Connection Status: 753985
Implantable Lead Implant Date: 20230103
Implantable Lead Location: 753860
Implantable Lead Model: 436909
Implantable Lead Serial Number: 81418533
Implantable Pulse Generator Implant Date: 20230103
Pulse Gen Model: 429525
Pulse Gen Serial Number: 84879015

## 2022-08-01 MED ORDER — LOSARTAN POTASSIUM 25 MG PO TABS: 25.0000 mg | ORAL_TABLET | Freq: Every day | ORAL | 3 refills | Status: AC

## 2022-08-01 NOTE — Patient Instructions (Signed)
Medication Instructions:  Your physician recommends that you continue on your current medications as directed. Please refer to the Current Medication list given to you today.  *If you need a refill on your cardiac medications before your next appointment, please call your pharmacy*  Lab Work: None ordered If you have labs (blood work) drawn today and your tests are completely normal, you will receive your results only by: MyChart Message (if you have MyChart) OR A paper copy in the mail If you have any lab test that is abnormal or we need to change your treatment, we will call you to review the results.   Follow-Up: At [facility], you and your health needs are our priority.  As part of our continuing mission to provide you with exceptional heart care, we have created designated Provider Care Teams.  These Care Teams include your primary Cardiologist (physician) and Advanced Practice Providers (APPs -  Physician Assistants and Nurse Practitioners) who all work together to provide you with the care you need, when you need it.  Your next appointment:   6 month(s)  Provider:   You may see Lanier Prude, MD or one of the following Advanced Practice Providers on your designated Care Team:   Francis Dowse, [place]" [place], [place] Canary Brim, NP

## 2022-08-07 NOTE — Progress Notes (Signed)
Remote ICD transmission.   

## 2022-10-01 IMAGING — CR DG CHEST 2V
2 series · 2 of 2 positions shown · non-contrast
Comparison: October 24, 2020

CLINICAL DATA: Shortness of breath and syncope.

EXAM:
CHEST - 2 VIEW

[w chest pa]
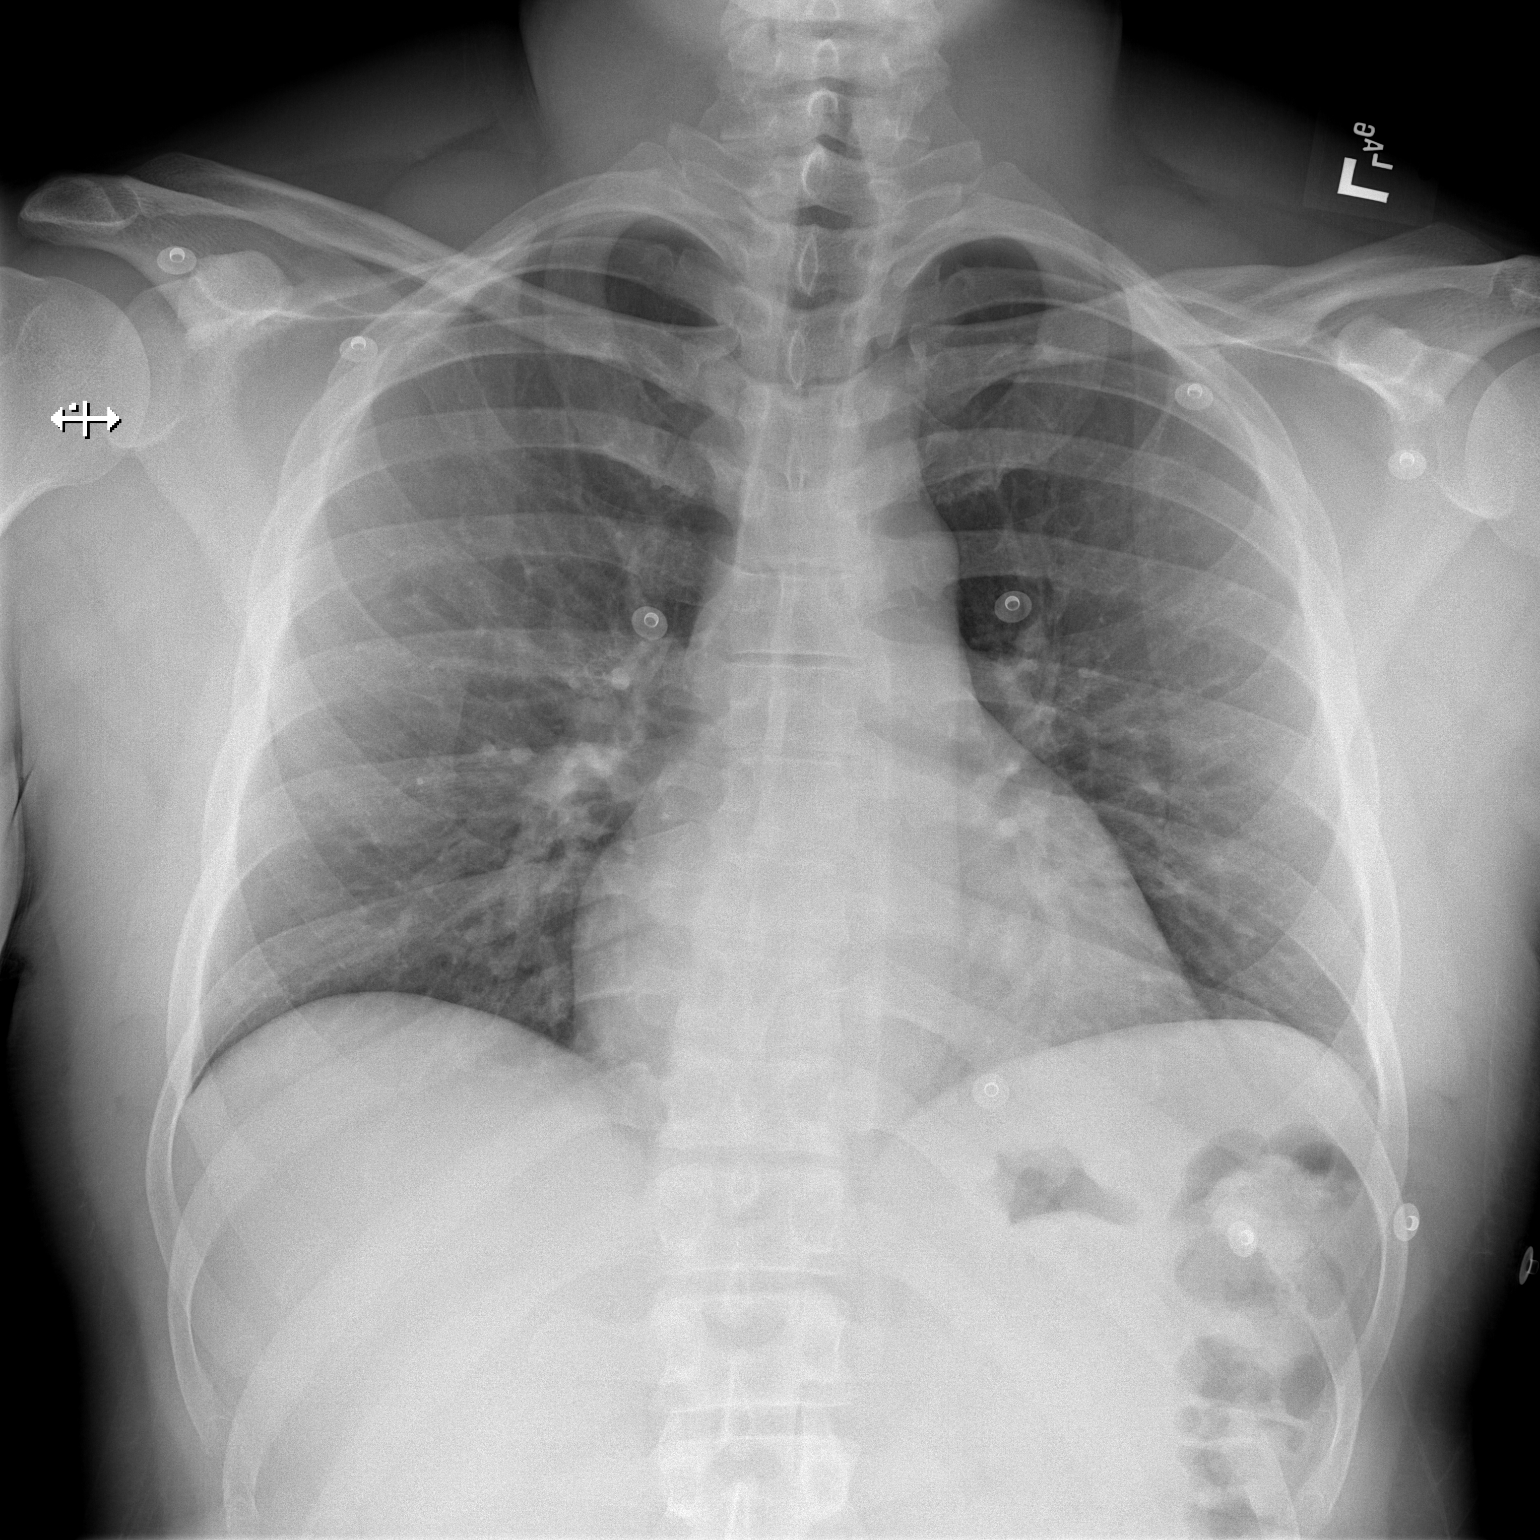

[w chest lat]
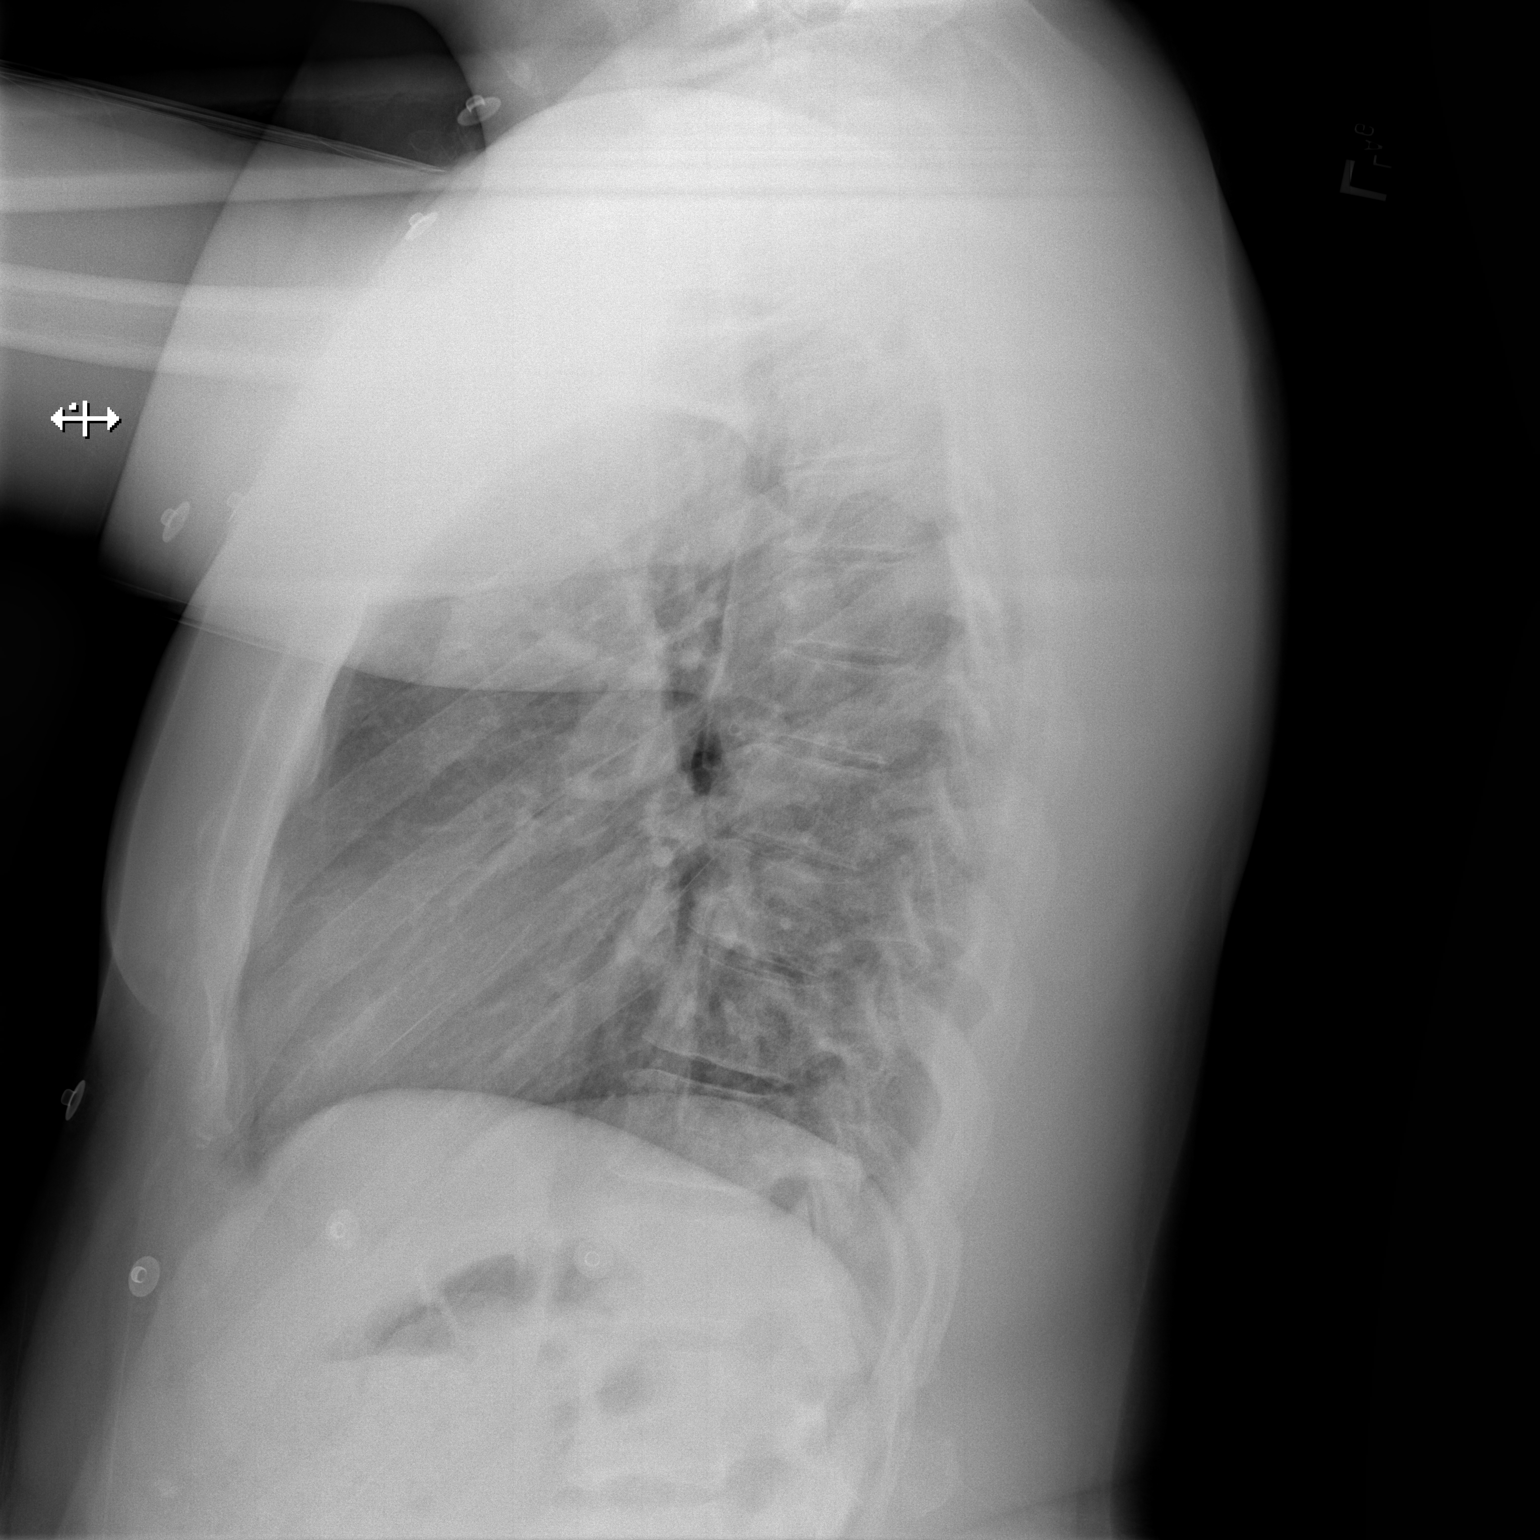

[2 of 2 positions shown; findings below may reference images not displayed]

FINDINGS: The heart size and mediastinal contours are within normal limits.
Both lungs are clear. The visualized skeletal structures are
unremarkable.
IMPRESSION: No active cardiopulmonary disease.

## 2022-10-04 IMAGING — CR DG CHEST 2V
2 series · 2 of 2 positions shown · non-contrast
Comparison: 01/15/2021

CLINICAL DATA: 35-year-old male with a history ICD implant

EXAM:
CHEST - 2 VIEW

[chest lat]
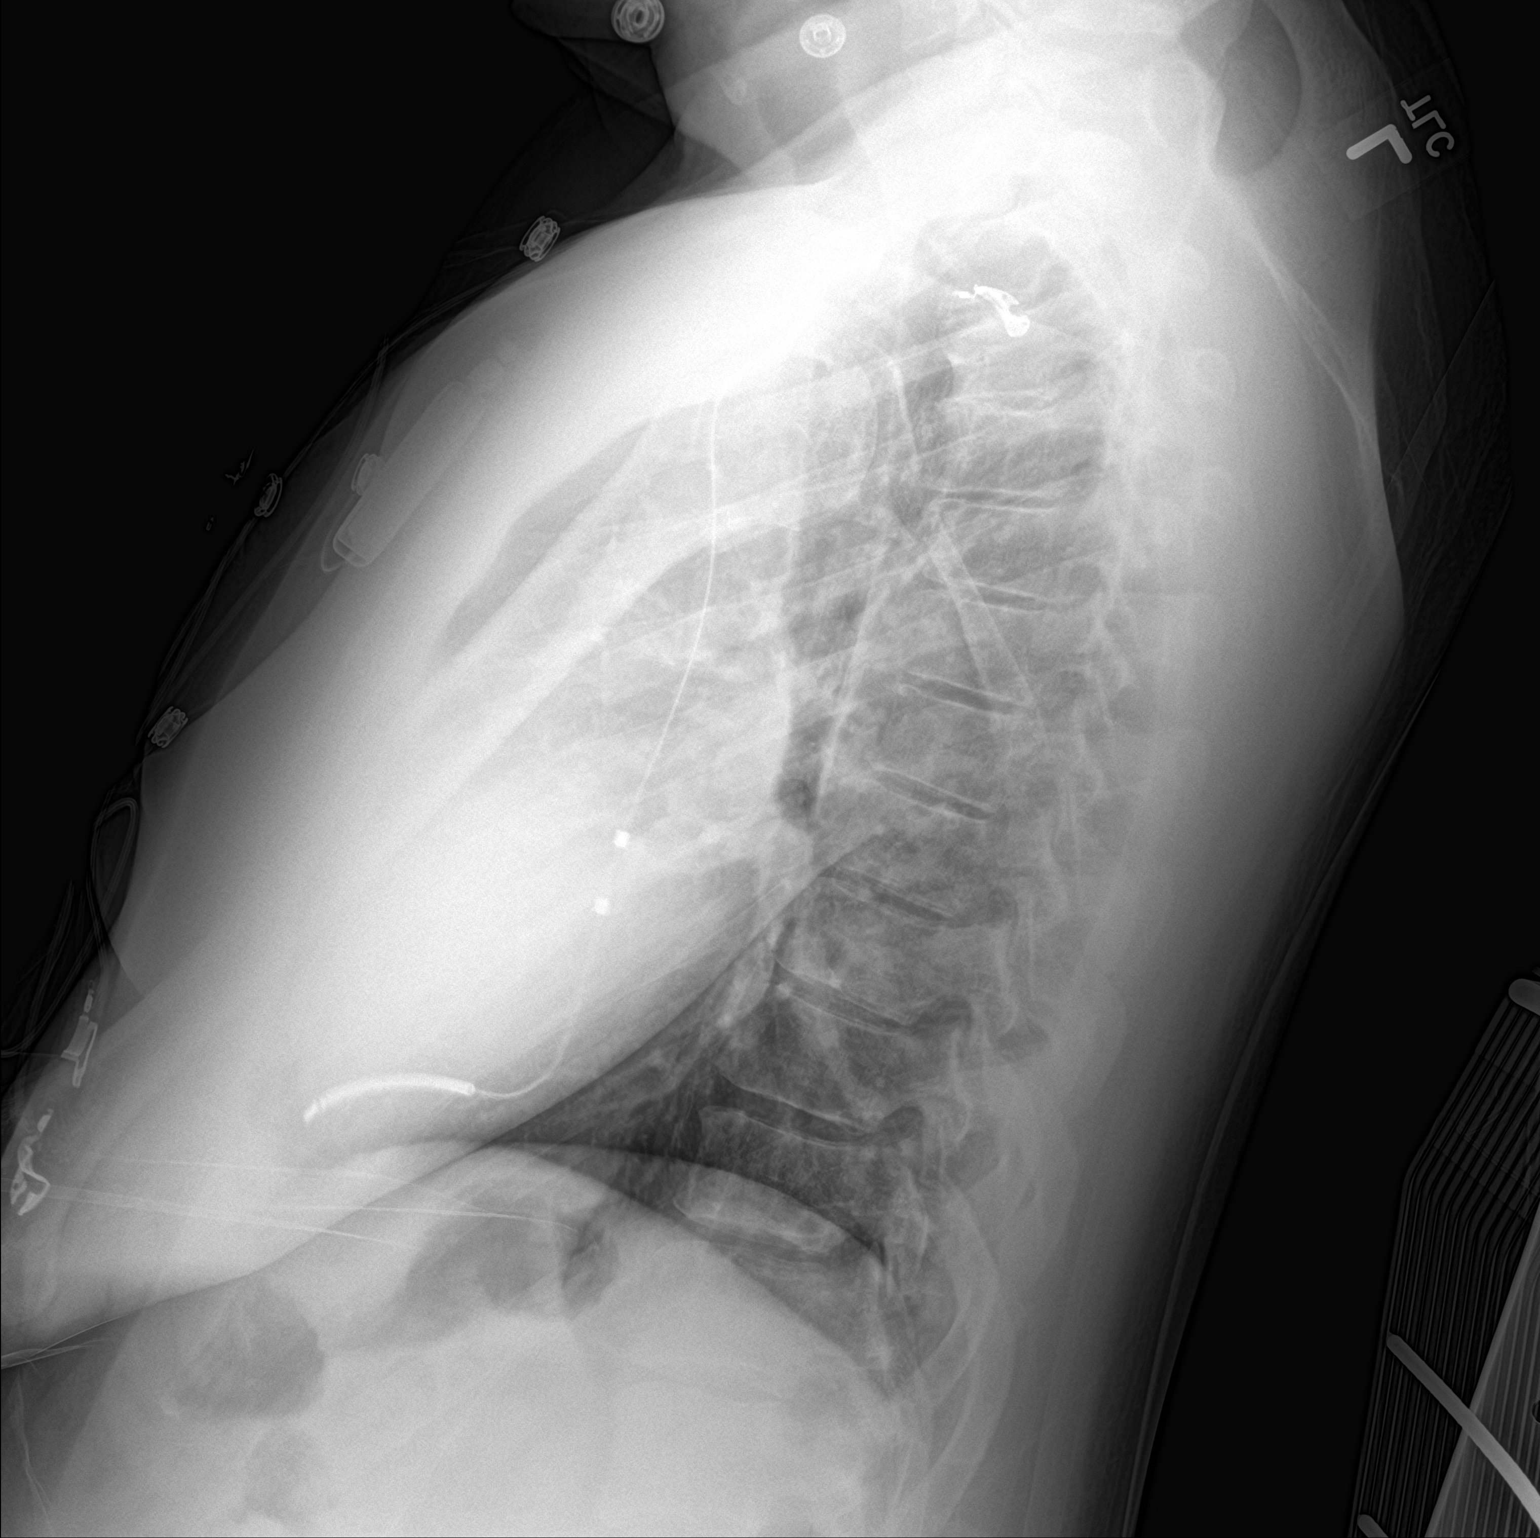

[chest ap]
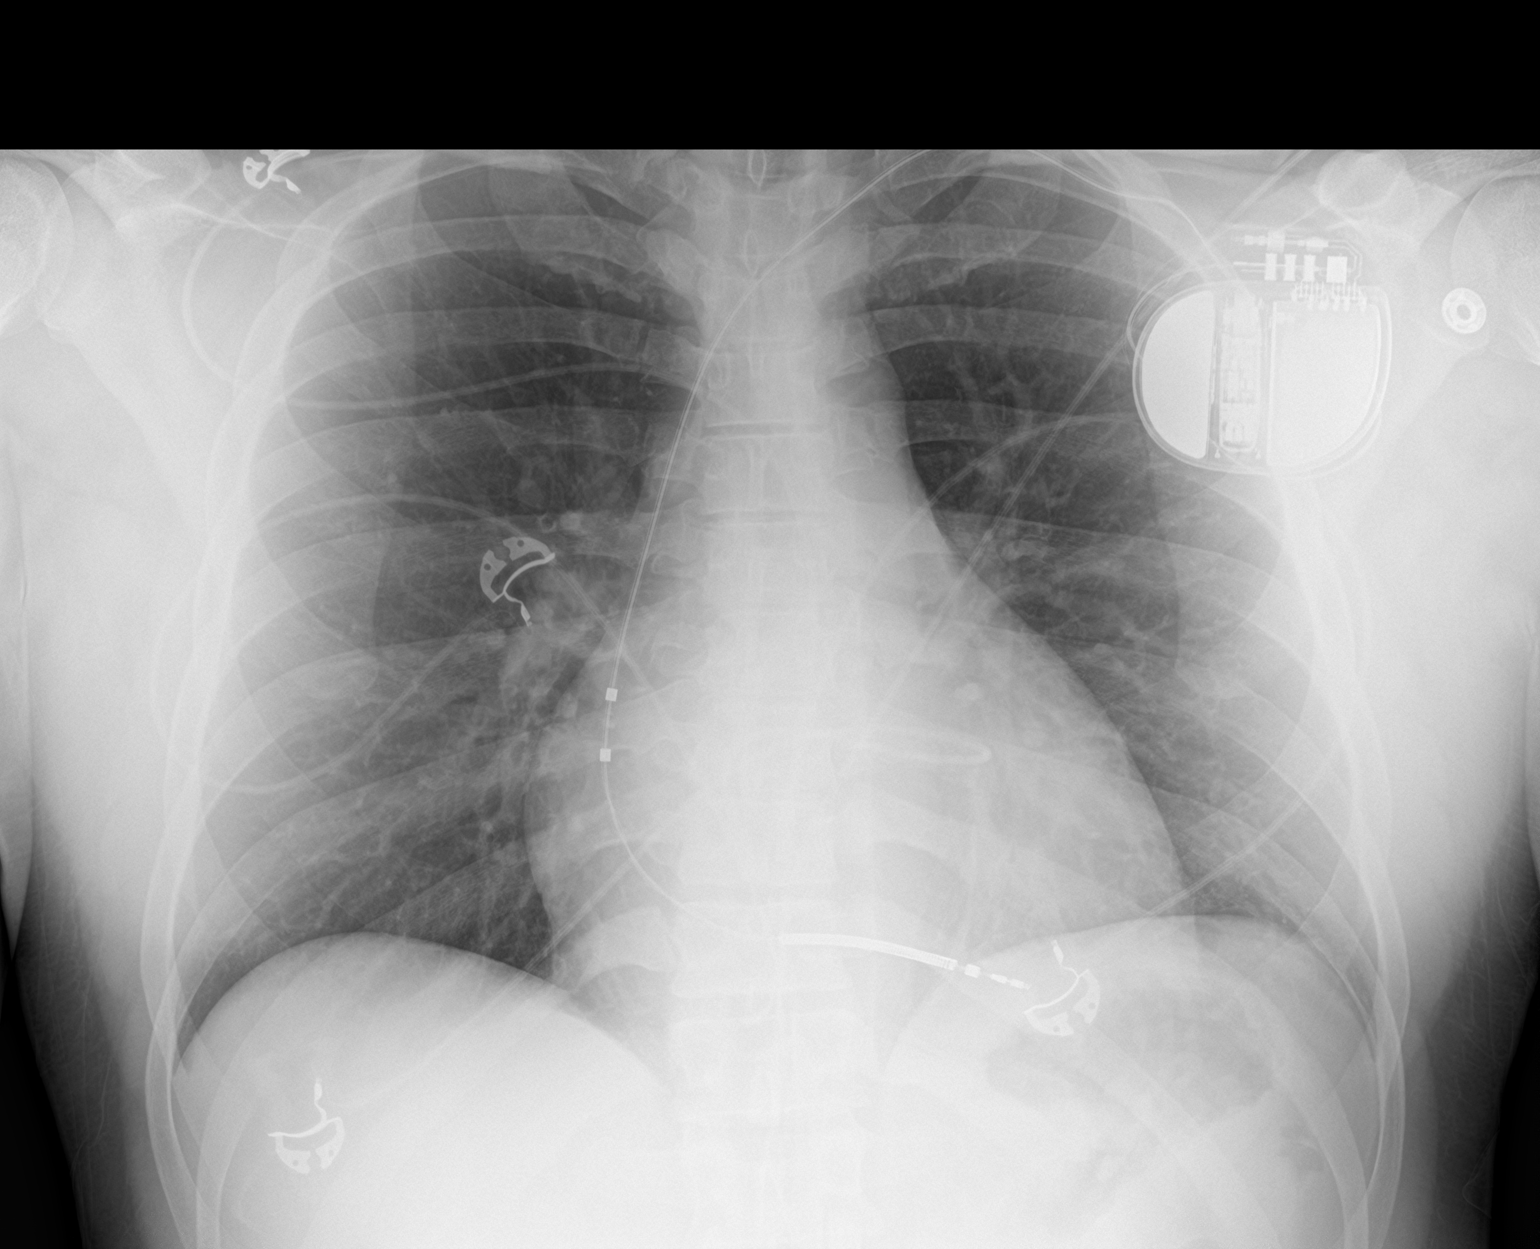

[2 of 2 positions shown; findings below may reference images not displayed]

FINDINGS: Cardiomediastinal silhouette unchanged in size and contour. Interval
placement of left chest wall cardiac AICD/pacer with single lead via
subclavian approach.

No visualized pneumothorax.

No evidence of central vascular congestion. No interlobular septal
thickening. No pleural effusion. No confluent airspace disease.

No acute displaced fracture
IMPRESSION: Interval placement of left chest wall cardiac AICD with no
complicating features

## 2022-10-16 ENCOUNTER — Telehealth: Payer: Self-pay

## 2022-10-16 NOTE — Telephone Encounter (Signed)
Biotronik alert received fro tachy event, unknown duration. Patient reports he was jogging on a treadmill during this time. Compliant with medications including Toprol- XL 50 mg BID. Patient advised to call if he has any symptoms. Pt voiced understanding. Continue to monitor.

## 2022-10-19 ENCOUNTER — Ambulatory Visit (INDEPENDENT_AMBULATORY_CARE_PROVIDER_SITE_OTHER): Payer: BLUE CROSS/BLUE SHIELD

## 2022-10-19 DIAGNOSIS — I428 Other cardiomyopathies: Secondary | ICD-10-CM

## 2022-10-20 LAB — CUP PACEART REMOTE DEVICE CHECK
Date Time Interrogation Session: 20241004120847
Implantable Lead Connection Status: 753985
Implantable Lead Implant Date: 20230103
Implantable Lead Location: 753860
Implantable Lead Model: 436909
Implantable Lead Serial Number: 81418533
Implantable Pulse Generator Implant Date: 20230103
Pulse Gen Model: 429525
Pulse Gen Serial Number: 84879015

## 2022-11-01 NOTE — Progress Notes (Signed)
Remote ICD transmission.   

## 2023-04-12 ENCOUNTER — Emergency Department (HOSPITAL_COMMUNITY)

## 2023-04-12 ENCOUNTER — Other Ambulatory Visit: Payer: Self-pay

## 2023-04-12 ENCOUNTER — Emergency Department (HOSPITAL_COMMUNITY)
Admission: EM | Admit: 2023-04-12 | Discharge: 2023-04-12 | Disposition: A | Discharge status: Home / Self Care | Attending: Emergency Medicine | Admitting: Emergency Medicine

## 2023-04-12 DIAGNOSIS — R079 Chest pain, unspecified: Secondary | ICD-10-CM | POA: Insufficient documentation

## 2023-04-12 DIAGNOSIS — Z7982 Long term (current) use of aspirin: Secondary | ICD-10-CM | POA: Diagnosis not present

## 2023-04-12 DIAGNOSIS — R0789 Other chest pain: Secondary | ICD-10-CM

## 2023-04-12 LAB — CBC
HCT: 44.3 % (ref 39.0–52.0)
Hemoglobin: 15.2 g/dL (ref 13.0–17.0)
MCH: 25.9 pg — ABNORMAL LOW (ref 26.0–34.0)
MCHC: 34.3 g/dL (ref 30.0–36.0)
MCV: 75.5 fL — ABNORMAL LOW (ref 80.0–100.0)
Platelets: 208 10*3/uL (ref 150–400)
RBC: 5.87 MIL/uL — ABNORMAL HIGH (ref 4.22–5.81)
RDW: 13.7 % (ref 11.5–15.5)
WBC: 7 10*3/uL (ref 4.0–10.5)
nRBC: 0 % (ref 0.0–0.2)

## 2023-04-12 LAB — BASIC METABOLIC PANEL WITH GFR
Anion gap: 11 (ref 5–15)
BUN: 12 mg/dL (ref 6–20)
CO2: 20 mmol/L — ABNORMAL LOW (ref 22–32)
Calcium: 9.4 mg/dL (ref 8.9–10.3)
Chloride: 105 mmol/L (ref 98–111)
Creatinine, Ser: 1.17 mg/dL (ref 0.61–1.24)
GFR, Estimated: >60 mL/min (ref >60)
Glucose, Bld: 108 mg/dL — ABNORMAL HIGH (ref 70–99)
Potassium: 3.1 mmol/L — ABNORMAL LOW (ref 3.5–5.1)
Sodium: 136 mmol/L (ref 135–145)

## 2023-04-12 LAB — TROPONIN I (HIGH SENSITIVITY): Troponin I (High Sensitivity): 14 ng/L (ref <18)

## 2023-04-12 MED ORDER — METHOCARBAMOL 500 MG PO TABS: 500.0000 mg | ORAL_TABLET | Freq: Two times a day (BID) | ORAL | 0 refills | Status: AC

## 2023-04-12 MED ORDER — LORAZEPAM 2 MG/ML IJ SOLN
0.5000 mg | Freq: Once | INTRAMUSCULAR | Status: AC
  Administered 2023-04-12: 0.5 mg via INTRAVENOUS
  Filled 2023-04-12: qty 1

## 2023-04-12 MED ORDER — IBUPROFEN 600 MG PO TABS: 600.0000 mg | ORAL_TABLET | Freq: Four times a day (QID) | ORAL | 0 refills | Status: AC | PRN

## 2023-04-12 MED ORDER — KETOROLAC TROMETHAMINE 30 MG/ML IJ SOLN
15.0000 mg | Freq: Once | INTRAMUSCULAR | Status: AC
  Administered 2023-04-12: 15 mg via INTRAVENOUS
  Filled 2023-04-12: qty 1

## 2023-04-12 MED ORDER — MORPHINE SULFATE (PF) 4 MG/ML IV SOLN
6.0000 mg | Freq: Once | INTRAVENOUS | Status: AC
  Administered 2023-04-12: 6 mg via INTRAVENOUS
  Filled 2023-04-12: qty 2

## 2023-04-12 NOTE — ED Notes (Signed)
Pt teaching provided on medications that may cause drowsiness. Pt instructed not to drive or operate heavy machinery while taking the prescribed medication. Pt verbalized understanding.   Pt provided discharge instructions and prescription information. Pt was given the opportunity to ask questions and questions were answered.   

## 2023-04-12 NOTE — ED Triage Notes (Signed)
 According to [facility]: Pt started having chest pain yesterday at work progressively getting worse, unable to sleep. Pain is sharp under left breasts increase with breathing and palpation. Pt has cardiac HX, Pacemaker in place but not pacmaking at moment.   EKG was unremarkable for ems.  Pt has 324 of Asprin before ems arrived, 0.4 mg of nitroglycerin given by ems, last bp was 115/85 automated. Sinus rhythm at 84 hr  100 spo2 on room air. 82 cbg.

## 2023-04-12 NOTE — ED Provider Notes (Signed)
 [place] EMERGENCY DEPARTMENT AT [facility] Provider Note   CSN: [id number] Arrival date & time: 04/12/23  1324     History  No chief complaint on file.   Dakota Weber is a 38 y.o. male.  38 year old male presents with chest pain which began yesterday.  Patient describes it as left-sided and sharp and worse with any movement.  He denies any severe dyspnea.  No leg pain or swelling.  Does have a prior history of cardiomyopathy and does have a ICD in.  Denies any syncope or near syncope.  No recent fever cough or congestion.  Pain is worse when he moves his arm.  I reviewed the patient's old chart extensively and patient had a negative coronary calcium score recently.  EMS was called and patient given aspirin and given 0.4 of nitroglycerin which had no effect on the patient's pain.       Home Medications Prior to Admission medications   Medication Sig Start Date End Date Taking? Authorizing Provider  aspirin EC 81 MG tablet Take 1 tablet (81 mg total) by mouth daily. Swallow whole. 10/31/21   Massengill, Harrold Donath, MD  atorvastatin (LIPITOR) 40 MG tablet Take 40 mg by mouth daily. 01/04/22   [provider]  empagliflozin (JARDIANCE) 10 MG TABS tablet Take 1 tablet (10 mg total) by mouth daily. 01/17/21   Arty Baumgartner, NP  losartan (COZAAR) 25 MG tablet Take 1 tablet (25 mg total) by mouth daily. 08/01/22   Jeanella Craze, NP  metoprolol succinate (TOPROL-XL) 50 MG 24 hr tablet Take 1 tablet (50 mg total) by mouth 2 (two) times daily. Take with or immediately following a meal. 07/09/22   Lanier Prude, MD  Multiple Vitamin (MULTIVITAMIN) capsule Take 1 capsule by mouth daily.    [provider]  sacubitril-valsartan (ENTRESTO) 24-26 MG Take 1 tablet by mouth 2 (two) times daily. Please hold entresto if systolic blood pressure less than 100. Patient not taking: Reported on 08/01/2022 01/17/22   Sherie Don, NP      Allergies    Ciprofloxacin     Review of Systems   Review of Systems  All other systems reviewed and are negative.   Physical Exam Updated Vital Signs Ht 1.854 m (6\' 1" )   Wt 108 kg   BMI 31.40 kg/m  Physical Exam Vitals and nursing note reviewed.  Constitutional:      General: He is not in acute distress.    Appearance: Normal appearance. He is well-developed. He is not toxic-appearing.  HENT:     Head: Normocephalic and atraumatic.  Eyes:     General: Lids are normal.     Conjunctiva/sclera: Conjunctivae normal.     Pupils: Pupils are equal, round, and reactive to light.  Neck:     Thyroid: No thyroid mass.     Trachea: No tracheal deviation.  Cardiovascular:     Rate and Rhythm: Normal rate and regular rhythm.     Heart sounds: Normal heart sounds. No murmur heard.    No gallop.  Pulmonary:     Effort: Pulmonary effort is normal. No respiratory distress.     Breath sounds: Normal breath sounds. No stridor. No decreased breath sounds, wheezing, rhonchi or rales.  Chest:    Abdominal:     General: There is no distension.     Palpations: Abdomen is soft.     Tenderness: There is no abdominal tenderness. There is no rebound.  Musculoskeletal:  General: No tenderness. Normal range of motion.     Cervical back: Normal range of motion and neck supple.  Skin:    General: Skin is warm and dry.     Findings: No abrasion or rash.  Neurological:     Mental Status: He is alert and oriented to person, place, and time. Mental status is at baseline.     GCS: GCS eye subscore is 4. GCS verbal subscore is 5. GCS motor subscore is 6.     Cranial Nerves: No cranial nerve deficit.     Sensory: No sensory deficit.     Motor: Motor function is intact.  Psychiatric:        Attention and Perception: Attention normal.        Speech: Speech normal.        Behavior: Behavior normal.     ED Results / Procedures / Treatments   Labs (all labs ordered are listed, but only abnormal results are  displayed) Labs Reviewed  BASIC METABOLIC PANEL WITH GFR  CBC  TROPONIN I (HIGH SENSITIVITY)    EKG EKG Interpretation Date/Time:  Friday April 12 2023 08:20:59 EDT Ventricular Rate:  69 PR Interval:  177 QRS Duration:  110 QT Interval:  393 QTC Calculation: 421 R Axis:   267  Text Interpretation: Sinus rhythm Left anterior fascicular block Abnormal R-wave progression, late transition Abnormal T, consider ischemia, diffuse leads No significant change since last tracing Confirmed by Lorre Nick ([id number]) on 04/12/2023 8:34:32 AM  Radiology No results found.  Procedures Procedures    Medications Ordered in ED Medications - No data to display  ED Course/ Medical Decision Making/ A&P                                 Medical Decision Making Amount and/or Complexity of Data Reviewed Labs: ordered. Radiology: ordered.  Risk Prescription drug management.   Patient's EKG shows normal sinus rhythm.  Unchanged from prior.  Patient's chest x-ray per my interpretation and review showed no acute findings.  Patient's pain is reproducible along his anterior chest wall.  Low suspicion for other etiologies such as pericarditis, ACS, PE.  Patient treated here and feels much better.  Patient's troponin here negative.  Does not need repeat being that his pain has been present for over 6 hours. Mild hypokalemia noted with K of 3.1.  Will give oral potassium.  Plan will be to discharge home       Final Clinical Impression(s) / ED Diagnoses Final diagnoses:  None    Rx / DC Orders ED Discharge Orders     None         Lorre Nick, MD 04/12/23 1049

## 2023-04-19 ENCOUNTER — Ambulatory Visit (INDEPENDENT_AMBULATORY_CARE_PROVIDER_SITE_OTHER): Payer: Self-pay

## 2023-04-19 DIAGNOSIS — I428 Other cardiomyopathies: Secondary | ICD-10-CM | POA: Diagnosis not present

## 2023-04-20 ENCOUNTER — Encounter: Payer: Self-pay | Admitting: Cardiology

## 2023-04-20 LAB — CUP PACEART REMOTE DEVICE CHECK
Date Time Interrogation Session: 20250404081438
Implantable Lead Connection Status: 753985
Implantable Lead Implant Date: 20230103
Implantable Lead Location: 753860
Implantable Lead Model: 436909
Implantable Lead Serial Number: 81418533
Implantable Pulse Generator Implant Date: 20230103
Pulse Gen Model: 429525
Pulse Gen Serial Number: 84879015

## 2023-04-25 NOTE — Progress Notes (Unsigned)
  Electrophysiology Office Note:   Date:  04/26/2023  ID:  Dakota Weber, DOB Sep 22, 1985, MRN [id number]  Primary Cardiologist: Wille Glaser, MD Primary Heart Failure: None Electrophysiologist: Lanier Prude, MD       History of Present Illness:   Dakota Weber is a 38 y.o. male with h/o NICM s/p ICD, PVC/NSVT, HLD, OSA, depression seen today for routine electrophysiology followup.   Since last being seen in our clinic the patient reports doing well. He denies any concerns regarding his device.  He asks about lifting weights and working out.  He reports he had to stop his Sherryll Burger because it was turning his lips dark.  He indicates he told his primary cardiology provider of the changes and medication adjustments were made.  He denies chest pain, palpitations, dyspnea, PND, orthopnea, nausea, vomiting, dizziness, syncope, edema, weight gain, or early satiety.   Review of systems complete and found to be negative unless listed in HPI.   EP Information / Studies Reviewed:    EKG is not ordered today. EKG from 04/12/23 reviewed which showed SR 69 bpm      ICD Interrogation-  reviewed in detail today,  See PACEART report.  Device History: Biotronik Single Chamber ICD implanted 01/17/21 for NICM.syncope, and strong family hx of SCD. POD1 had pleuritic chest pain and lead was revised. VDD lead.  History of appropriate therapy: No, inappropriate for SVT History of AAD therapy: No   Studies:  Coronary CTA ([facility]) 10/2020 > normal coronaries, CCS 0  cMRI ([facility]) 12/2020 > LVEF 28%, severely reduced LV systolic function with global hypokinesis, dilated cardiomyopathy, myocarditis and sarcoidosis are on the differential  PET 01/2021 > negative for sarcoid  ECHO 04/2022 > LVEF 25-30%, LV severely decreased function & global hypokinesis, LV mildly dilated, G2 DD, RV systolic function normal      Physical Exam:   VS:  BP (!) 118/92 (BP Location: Left Arm, Patient Position: Sitting, Cuff  Size: Large)   Pulse 62   Resp 16   Ht 6\' 1"  (1.854 m)   Wt 244 lb 6.4 oz (110.9 kg)   SpO2 98%   BMI 32.24 kg/m    Wt Readings from Last 3 Encounters:  04/26/23 244 lb 6.4 oz (110.9 kg)  04/12/23 238 lb (108 kg)  08/01/22 237 lb 3.2 oz (107.6 kg)     GEN: Well nourished, well developed in no acute distress NECK: No JVD; No carotid bruits CARDIAC: Regular rate and rhythm, no murmurs, rubs, gallops, ICD site well-healed, no tethering RESPIRATORY:  Clear to auscultation without rales, wheezing or rhonchi  ABDOMEN: Soft, non-tender, non-distended EXTREMITIES:  No edema; No deformity   ASSESSMENT AND PLAN:    Chronic Systolic Dysfunction due to NICM s/p Biotronik single chamber ICD  NSVT / PVC's  Inolerant to entresto due to side effect in lips > darkening of skin, but not clearly angioedema  -euvolemic on exam and by device  -Stable on an appropriate medical regimen > jardiance, cozaar, toprol -Normal ICD function -See Pace Art report -No changes today -follows with [facility] for HF  Hypertension  -well controlled on current regimen   Disposition:   Follow up with Dr. Lalla Brothers in 12 months   Signed, Canary Brim, NP-C, AGACNP-BC [facility] - Electrophysiology  04/26/2023, 1:12 PM

## 2023-04-26 ENCOUNTER — Ambulatory Visit: Attending: Pulmonary Disease | Admitting: Pulmonary Disease

## 2023-04-26 ENCOUNTER — Encounter: Payer: Self-pay | Admitting: Pulmonary Disease

## 2023-04-26 VITALS — BP 118/92 | HR 62 | Resp 16 | Ht 73.0 in | Wt 244.4 lb

## 2023-04-26 DIAGNOSIS — I428 Other cardiomyopathies: Secondary | ICD-10-CM | POA: Diagnosis not present

## 2023-04-26 DIAGNOSIS — Z95810 Presence of automatic (implantable) cardiac defibrillator: Secondary | ICD-10-CM | POA: Diagnosis not present

## 2023-04-26 DIAGNOSIS — I5022 Chronic systolic (congestive) heart failure: Secondary | ICD-10-CM

## 2023-04-26 DIAGNOSIS — I493 Ventricular premature depolarization: Secondary | ICD-10-CM | POA: Diagnosis not present

## 2023-04-26 DIAGNOSIS — I4729 Other ventricular tachycardia: Secondary | ICD-10-CM

## 2023-04-26 DIAGNOSIS — I10 Essential (primary) hypertension: Secondary | ICD-10-CM

## 2023-04-26 LAB — CUP PACEART INCLINIC DEVICE CHECK
Date Time Interrogation Session: 20250411131256
Implantable Lead Connection Status: 753985
Implantable Lead Implant Date: 20230103
Implantable Lead Location: 753860
Implantable Lead Model: 436909
Implantable Lead Serial Number: 81418533
Implantable Pulse Generator Implant Date: 20230103
Pulse Gen Model: 429525
Pulse Gen Serial Number: 84879015

## 2023-04-26 NOTE — Patient Instructions (Signed)
 Medication Instructions:  Your physician recommends that you continue on your current medications as directed. Please refer to the Current Medication list given to you today.  *If you need a refill on your cardiac medications before your next appointment, please call your pharmacy*  Lab Work: None ordered If you have labs (blood work) drawn today and your tests are completely normal, you will receive your results only by: MyChart Message (if you have MyChart) OR A paper copy in the mail If you have any lab test that is abnormal or we need to change your treatment, we will call you to review the results.  Follow-Up: At [facility], you and your health needs are our priority.  As part of our continuing mission to provide you with exceptional heart care, our providers are all part of one team.  This team includes your primary Cardiologist (physician) and Advanced Practice Providers or APPs (Physician Assistants and Nurse Practitioners) who all work together to provide you with the care you need, when you need it.  Your next appointment:   6 month(s)  Provider:   Steffanie Dunn, MD      1st Floor: - Lobby - Registration  - Pharmacy  - Lab - Cafe  2nd Floor: - PV Lab - Diagnostic Testing (echo, CT, nuclear med)  3rd Floor: - Vacant  4th Floor: - TCTS (cardiothoracic surgery) - AFib Clinic - Structural Heart Clinic - Vascular Surgery  - Vascular Ultrasound  5th Floor: - HeartCare Cardiology (general and EP) - Clinical Pharmacy for coumadin, hypertension, lipid, weight-loss medications, and med management appointments    Valet parking services will be available as well.

## 2023-04-30 ENCOUNTER — Encounter: Payer: Self-pay | Admitting: Cardiology

## 2023-05-27 NOTE — Addendum Note (Signed)
 Addended by: Lott Rouleau A on: 05/27/2023 11:16 AM   Modules accepted: Orders

## 2023-05-27 NOTE — Progress Notes (Signed)
 Remote ICD transmission.

## 2023-07-22 ENCOUNTER — Ambulatory Visit

## 2023-07-22 DIAGNOSIS — I428 Other cardiomyopathies: Secondary | ICD-10-CM

## 2023-07-23 LAB — CUP PACEART REMOTE DEVICE CHECK
Date Time Interrogation Session: 20250707120506
Implantable Lead Connection Status: 753985
Implantable Lead Implant Date: 20230103
Implantable Lead Location: 753860
Implantable Lead Model: 436909
Implantable Lead Serial Number: 81418533
Implantable Pulse Generator Implant Date: 20230103
Pulse Gen Model: 429525
Pulse Gen Serial Number: 84879015

## 2023-07-27 ENCOUNTER — Ambulatory Visit: Payer: Self-pay | Admitting: Cardiology

## 2023-08-14 ENCOUNTER — Emergency Department (HOSPITAL_COMMUNITY)

## 2023-08-14 ENCOUNTER — Other Ambulatory Visit: Payer: Self-pay

## 2023-08-14 ENCOUNTER — Emergency Department (HOSPITAL_COMMUNITY)
Admission: EM | Admit: 2023-08-14 | Discharge: 2023-08-14 | Disposition: A | Discharge status: Home / Self Care | Attending: Emergency Medicine | Admitting: Emergency Medicine

## 2023-08-14 DIAGNOSIS — I509 Heart failure, unspecified: Secondary | ICD-10-CM | POA: Diagnosis not present

## 2023-08-14 DIAGNOSIS — R0789 Other chest pain: Secondary | ICD-10-CM | POA: Diagnosis present

## 2023-08-14 DIAGNOSIS — R079 Chest pain, unspecified: Secondary | ICD-10-CM

## 2023-08-14 DIAGNOSIS — Z7982 Long term (current) use of aspirin: Secondary | ICD-10-CM | POA: Diagnosis not present

## 2023-08-14 LAB — CBC
HCT: 45.2 % (ref 39.0–52.0)
Hemoglobin: 15.1 g/dL (ref 13.0–17.0)
MCH: 25.9 pg — ABNORMAL LOW (ref 26.0–34.0)
MCHC: 33.4 g/dL (ref 30.0–36.0)
MCV: 77.5 fL — ABNORMAL LOW (ref 80.0–100.0)
Platelets: 210 K/uL (ref 150–400)
RBC: 5.83 MIL/uL — ABNORMAL HIGH (ref 4.22–5.81)
RDW: 14 % (ref 11.5–15.5)
WBC: 7.4 K/uL (ref 4.0–10.5)
nRBC: 0 % (ref 0.0–0.2)

## 2023-08-14 LAB — BASIC METABOLIC PANEL WITH GFR
Anion gap: 5 (ref 5–15)
BUN: 9 mg/dL (ref 6–20)
CO2: 24 mmol/L (ref 22–32)
Calcium: 7.6 mg/dL — ABNORMAL LOW (ref 8.9–10.3)
Chloride: 112 mmol/L — ABNORMAL HIGH (ref 98–111)
Creatinine, Ser: 0.92 mg/dL (ref 0.61–1.24)
GFR, Estimated: >60 mL/min (ref >60)
Glucose, Bld: 72 mg/dL (ref 70–99)
Potassium: 3.8 mmol/L (ref 3.5–5.1)
Sodium: 141 mmol/L (ref 135–145)

## 2023-08-14 LAB — TROPONIN I (HIGH SENSITIVITY)
Troponin I (High Sensitivity): 12 ng/L (ref <18)
Troponin I (High Sensitivity): 14 ng/L (ref <18)

## 2023-08-14 LAB — BRAIN NATRIURETIC PEPTIDE: B Natriuretic Peptide: 31.6 pg/mL (ref 0.0–100.0)

## 2023-08-14 MED ORDER — CALCIUM CARBONATE ANTACID 500 MG PO CHEW
400.0000 mg | CHEWABLE_TABLET | Freq: Once | ORAL | Status: AC
  Administered 2023-08-14: 400 mg via ORAL
  Filled 2023-08-14: qty 2

## 2023-08-14 MED ORDER — IPRATROPIUM-ALBUTEROL 0.5-2.5 (3) MG/3ML IN SOLN
3.0000 mL | Freq: Once | RESPIRATORY_TRACT | Status: AC
  Administered 2023-08-14: 3 mL via RESPIRATORY_TRACT
  Filled 2023-08-14: qty 3

## 2023-08-14 MED ORDER — KETOROLAC TROMETHAMINE 15 MG/ML IJ SOLN
10.0000 mg | Freq: Once | INTRAMUSCULAR | Status: AC
  Administered 2023-08-14: 10 mg via INTRAVENOUS
  Filled 2023-08-14: qty 1

## 2023-08-14 NOTE — ED Provider Notes (Signed)
  Physical Exam  BP 130/70   Pulse (!) 54   Temp 98 F (36.7 C) (Oral)   Resp (!) 22   SpO2 100%   Physical Exam  Procedures  Procedures  ED Course / MDM    Medical Decision Making Amount and/or Complexity of Data Reviewed Labs: ordered. Radiology: ordered.  Risk OTC drugs. Prescription drug management.   I, Larnell Drones, assumed care for this patient.  In brief, 38 year old male here today with some chest tightness, since improved.  Patient has a past medical history significant for CHF, cardioverter defibrillator.  He has not had any recent activity on his defibrillator.  His troponin is negative x 2.  EKG is at his baseline.  Patient felt considerably better after albuterol , Toradol .  I considered pericarditis in this patient, however no murmur, no EKG changes.  Certainly would consider if patient continue to have symptoms.  Will advise him to follow-up with his PCP and cardiologist.       Mannie Pac T, DO 08/14/23 1759

## 2023-08-14 NOTE — ED Notes (Signed)
 Patient transported to X-ray

## 2023-08-14 NOTE — ED Triage Notes (Addendum)
 PT arrives via EMS from work. PT reports intermittent chest pain for the past week. States chest pain has become constant since 0300 this morning. PT denies associated symptoms. He is AxOx4. Documented hx of Vtach and CHF. EMS administered 324mg  of aspirin  and 1 nitroglycerin with some relief.

## 2023-08-14 NOTE — ED Provider Triage Note (Signed)
 Emergency Medicine Provider Triage Evaluation Note  Dakota Weber , a 38 y.o. male  was evaluated in triage.  Pt complains of chest tightness, pressure, sharp sensation.  Symptoms ongoing the past few days, intermittent.  Seem to be more noticeable when he is trying to go to bed or lying down flat.  No significant dyspnea.  No significant leg swelling.  He is compliant with his home medications.  He follows with [facility]ology at [facility]  Review of Systems  Positive: Chest pain Negative: Fever  Physical Exam  BP 117/76 (BP Location: Left Arm)   Pulse (!) 51   Temp 98.3 F (36.8 C)   Resp 18   SpO2 97%  Gen:   Awake, no distress   Resp:  Normal effort  MSK:   Moves extremities without difficulty  Other:  No lower extremity edema  Medical Decision Making  Medically screening exam initiated at 12:54 PM.  Appropriate orders placed.  Dakota Weber was informed that the remainder of the evaluation will be completed by another provider, this initial triage assessment does not replace that evaluation, and the importance of remaining in the ED until their evaluation is complete.     Elnor Savant A, DO 08/14/23 1254

## 2023-08-14 NOTE — Discharge Instructions (Signed)
 While you were in the emergency room, you had blood work done that overall was normal.  Your EKG did not show any changes, your chest x-ray was normal.  Like we discussed, at this time I do not have a clear cause for your symptoms.  I recommend that you follow-up with your primary care doctor and your cardiologist.  You may take Motrin  and Tylenol  at home.  Return to the emergency room for any new or worsening pain in your chest, or difficulty breathing.

## 2023-08-14 NOTE — ED Provider Notes (Signed)
 [place] EMERGENCY DEPARTMENT AT [facility] Provider Note  CSN: [id number] Arrival date & time: 08/14/23 1231  Chief Complaint(s) No chief complaint on file.  HPI Dakota Weber is a 38 y.o. male with past medical history as below, significant for CHF, cardioverter defibrillator, OSA, MDD who presents to the ED with complaint of chest tightness  Patient reports chest discomfort over the past week, has worsened in the past 24 hours.  Feels a tightness in his chest at times, no palpitations or lightheadedness, no syncope or near syncope, no diaphoresis nausea or vomiting.  He feels the pain is most noticeable when he is try to go to sleep when he lays down flat.  He was given aspirin  and nitroglycerin by EMS, reports some mild relief.  Chest pain at this time is described as mild, more so a tightness sensation  Past Medical History Past Medical History:  Diagnosis Date   CHF (congestive heart failure) (HCC)    V-tach [facility])    Patient Active Problem List   Diagnosis Date Noted   Chest pain 04/17/2022   Implantable cardioverter-defibrillator (ICD) in situ 01/15/2022   PVC (premature ventricular contraction) 01/15/2022   Obstructive sleep apnea 10/28/2021   Major depressive disorder, recurrent episode (HCC) 10/27/2021   MDD (major depressive disorder), recurrent severe, without psychosis (HCC) 10/27/2021   Hyperlipidemia 01/16/2021   Syncope 01/15/2021   NICM (nonischemic cardiomyopathy) (HCC) 01/15/2021   NSVT (nonsustained ventricular tachycardia) (HCC) 01/15/2021   Home Medication(s) Prior to Admission medications   Medication Sig Start Date End Date Taking? Authorizing Provider  aspirin  EC 81 MG tablet Take 1 tablet (81 mg total) by mouth daily. Swallow whole. 10/31/21   Massengill, Rankin, MD  atorvastatin  (LIPITOR) 40 MG tablet Take 40 mg by mouth daily. 01/04/22   [provider]  empagliflozin  (JARDIANCE ) 10 MG TABS tablet Take 1 tablet (10 mg total) by  mouth daily. 01/17/21   Henry Manuelita NOVAK, NP  ibuprofen  (ADVIL ) 600 MG tablet Take 1 tablet (600 mg total) by mouth every 6 (six) hours as needed. 04/12/23   Dasie Faden, MD  losartan  (COZAAR ) 25 MG tablet Take 1 tablet (25 mg total) by mouth daily. 08/01/22   Aniceto Daphne CROME, NP  methocarbamol  (ROBAXIN ) 500 MG tablet Take 1 tablet (500 mg total) by mouth 2 (two) times daily. 04/12/23   Dasie Faden, MD  metoprolol  succinate (TOPROL -XL) 50 MG 24 hr tablet Take 1 tablet (50 mg total) by mouth 2 (two) times daily. Take with or immediately following a meal. 07/09/22   Cindie Ole DASEN, MD  Multiple Vitamin (MULTIVITAMIN) capsule Take 1 capsule by mouth daily.    [provider]                                                                                                                                    Past Surgical History Past Surgical History:  Procedure Laterality Date  AMPUTATION Left 03/20/2012   Procedure: IRRIGATION AND DEBRIDEMENT REVISION AMPUTATION LEFT FINGER;  Surgeon: Franky JONELLE Curia, MD;  Location: [facility] OR;  Service: Orthopedics;  Laterality: Left;   I & D EXTREMITY Left 03/20/2012   Procedure: IRRIGATION LEFT LONG FINGER;  Surgeon: Franky JONELLE Curia, MD;  Location: [facility] OR;  Service: Orthopedics;  Laterality: Left;   ICD IMPLANT N/A 01/17/2021   Procedure: ICD IMPLANT;  Surgeon: Waddell Danelle ORN, MD;  Location: [facility] INVASIVE CV LAB;  Service: Cardiovascular;  Laterality: N/A;   LEAD REVISION/REPAIR N/A 01/18/2021   Procedure: LEAD REVISION/REPAIR;  Surgeon: Waddell Danelle ORN, MD;  Location: [facility] INVASIVE CV LAB;  Service: Cardiovascular;  Laterality: N/A;   Family History Family History  Problem Relation Age of Onset   Hypertension Mother    Cancer Father     Social History Social History   Tobacco Use   Smoking status: Never   Smokeless tobacco: Never  Vaping Use   Vaping status: Never Used  Substance Use Topics   Alcohol use: Yes    Comment: occasionally   Drug use: No    Allergies Ciprofloxacin  Review of Systems A thorough review of systems was obtained and all systems are negative except as noted in the HPI and PMH.   Physical Exam Vital Signs  I have reviewed the triage vital signs BP 130/70   Pulse (!) 54   Temp 98 F (36.7 C) (Oral)   Resp (!) 22   SpO2 100%  Physical Exam Vitals and nursing note reviewed.  Constitutional:      General: He is not in acute distress.    Appearance: He is well-developed.  HENT:     Head: Normocephalic and atraumatic.     Right Ear: External ear normal.     Left Ear: External ear normal.     Mouth/Throat:     Mouth: Mucous membranes are moist.  Eyes:     General: No scleral icterus. Cardiovascular:     Rate and Rhythm: Normal rate and regular rhythm.     Pulses: Normal pulses.     Heart sounds: Normal heart sounds.  Pulmonary:     Effort: Pulmonary effort is normal. No respiratory distress.     Breath sounds: Normal breath sounds.  Chest:    Abdominal:     General: Abdomen is flat.     Palpations: Abdomen is soft.     Tenderness: There is no abdominal tenderness.  Musculoskeletal:     Cervical back: No rigidity.     Right lower leg: No edema.     Left lower leg: No edema.  Skin:    General: Skin is warm and dry.     Capillary Refill: Capillary refill takes less than 2 seconds.  Neurological:     Mental Status: He is alert.  Psychiatric:        Mood and Affect: Mood normal.        Behavior: Behavior normal.     ED Results and Treatments Labs (all labs ordered are listed, but only abnormal results are displayed) Labs Reviewed  BASIC METABOLIC PANEL WITH GFR - Abnormal; Notable for the following components:      Result Value   Chloride 112 (*)    Calcium  7.6 (*)    All other components within normal limits  CBC - Abnormal; Notable for the following components:   RBC 5.83 (*)    MCV 77.5 (*)    MCH 25.9 (*)    All  other components within normal limits  BRAIN NATRIURETIC PEPTIDE   TROPONIN I (HIGH SENSITIVITY)  TROPONIN I (HIGH SENSITIVITY)                                                                                                                          Radiology DG Chest 2 View Result Date: 08/14/2023 EXAM: 2 VIEW(S) XRAY OF THE CHEST 08/14/2023 01:12:00 PM COMPARISON: 04/12/2023 CLINICAL HISTORY: Chest pain, chest tightness, pressure, sharp sensation. Symptoms ongoing the past few days, intermittent. FINDINGS: LUNGS AND PLEURA: No focal pulmonary opacity. No pulmonary edema. No pleural effusion. No pneumothorax. HEART AND MEDIASTINUM: No acute abnormality of the cardiac and mediastinal silhouettes. Single lead left chest wall ICD noted with lead in the right ventricle. BONES AND SOFT TISSUES: No acute osseous abnormality. IMPRESSION: 1. No acute cardiopulmonary pathology. Electronically signed by: Waddell Calk MD 08/14/2023 01:20 PM EDT RP Workstation: [id number]    Pertinent labs & imaging results that were available during my care of the patient were reviewed by me and considered in my medical decision making (see MDM for details).  Medications Ordered in ED Medications  ipratropium-albuterol  (DUONEB) 0.5-2.5 (3) MG/3ML nebulizer solution 3 mL (has no administration in time range)  calcium  carbonate (TUMS - dosed in mg elemental calcium ) chewable tablet 400 mg of elemental calcium  (has no administration in time range)  ketorolac  (TORADOL ) 15 MG/ML injection 10 mg (has no administration in time range)                                                                                                                                     Procedures Procedures  (including critical care time)  Medical Decision Making / ED Course    Medical Decision Making:    Ancil Dewan is a 38 y.o. male with past medical history as below, significant for CHF, cardioverter defibrillator, OSA, MDD who presents to the ED with complaint of chest tightness. The complaint  involves an extensive differential diagnosis and also carries with it a high risk of complications and morbidity.  Serious etiology was considered. Ddx includes but is not limited to: Differential includes all life-threatening causes for chest pain. This includes but is not exclusive to acute coronary syndrome, aortic dissection, pulmonary embolism, cardiac tamponade, community-acquired pneumonia, pericarditis, musculoskeletal chest wall pain, etc.   Complete initial physical exam performed, notably the patient was in no acute distress,  sitting comfortably on stretcher.    Reviewed and confirmed nursing documentation for past medical history, family history, social history.  Vital signs reviewed.    Chest tightness  Hx CHF, AICD, VT, OSA > - Was given aspirin  by EMS. - EKG similar to prior, T wave inversions unchanged from prior - Troponin negative x 2, chest x-ray unremarkable - Calcium  was low, replaced orally > recommend he follow-up with PCP for recheck - BNP is not elevated, does not appear to be acutely volume overloaded, CHF exacerbation unlikely - PPM interrogated, no acute events in the past 7 days - Patient given nebulized breathing treatment - In light of negative troponin, ACS seems unlikely at this time       >> Coronary CTA with no CAD with calcium  score 0 and LV EF 25-45% from Oct, 2022 NICMY with LV EF 28% by cardiac MRI from Dec, 2022,               Additional history obtained: -Additional history obtained from na -External records from outside source obtained and reviewed including: Chart review including previous notes, labs, imaging, consultation notes including  Cardiology documentation, prior ER visit, prior labs   Lab Tests: -I ordered, reviewed, and interpreted labs.   The pertinent results include:   Labs Reviewed  BASIC METABOLIC PANEL WITH GFR - Abnormal; Notable for the following components:      Result Value   Chloride 112 (*)    Calcium   7.6 (*)    All other components within normal limits  CBC - Abnormal; Notable for the following components:   RBC 5.83 (*)    MCV 77.5 (*)    MCH 25.9 (*)    All other components within normal limits  BRAIN NATRIURETIC PEPTIDE  TROPONIN I (HIGH SENSITIVITY)  TROPONIN I (HIGH SENSITIVITY)    Notable for calcium  low  EKG   EKG Interpretation Date/Time:  Wednesday August 14 2023 12:42:51 EDT Ventricular Rate:  51 PR Interval:  178 QRS Duration:  102 QT Interval:  408 QTC Calculation: 376 R Axis:   -83  Text Interpretation: Sinus bradycardia Left anterior fascicular block T wave abnormality, consider inferolateral ischemia Abnormal ECG When compared with ECG of 12-Apr-2023 08:20, PREVIOUS ECG IS PRESENT rate reduced from prior, similar to EKG 4/24. diffuse TWI noted on prior Confirmed by Elnor Savant ([id number]) on 08/14/2023 12:47:16 PM         Imaging Studies ordered: I ordered imaging studies including CXR I independently visualized the following imaging with scope of interpretation limited to determining acute life threatening conditions related to emergency care; findings noted above I agree with the radiologist interpretation If any imaging was obtained with contrast I closely monitored patient for any possible adverse reaction a/w contrast administration in the emergency department   Medicines ordered and prescription drug management: Meds ordered this encounter  Medications   ipratropium-albuterol  (DUONEB) 0.5-2.5 (3) MG/3ML nebulizer solution 3 mL   calcium  carbonate (TUMS - dosed in mg elemental calcium ) chewable tablet 400 mg of elemental calcium    ketorolac  (TORADOL ) 15 MG/ML injection 10 mg    -I have reviewed the patients home medicines and have made adjustments as needed   Consultations Obtained: na   Cardiac Monitoring: The patient was maintained on a cardiac monitor.  I personally viewed and interpreted the cardiac monitored which showed an underlying rhythm  of: nsr Continuous pulse oximetry interpreted by myself, 100% on ra.    Social Determinants of Health:  Diagnosis or treatment  significantly limited by social determinants of health: na   Reevaluation: After the interventions noted above, I reevaluated the patient and found that they have improved  Co morbidities that complicate the patient evaluation  Past Medical History:  Diagnosis Date   CHF (congestive heart failure) (HCC)    V-tach (HCC)       Dispostion: Disposition decision including need for hospitalization was considered, and patient disposition pending at time of sign out.    Final Clinical Impression(s) / ED Diagnoses Final diagnoses:  Hypocalcemia  Chest pain, unspecified type        Elnor Jayson LABOR, DO 08/14/23 1655

## 2023-08-16 ENCOUNTER — Other Ambulatory Visit: Payer: Self-pay | Admitting: Pulmonary Disease

## 2023-10-21 ENCOUNTER — Ambulatory Visit

## 2023-10-21 DIAGNOSIS — I428 Other cardiomyopathies: Secondary | ICD-10-CM

## 2023-10-23 LAB — CUP PACEART REMOTE DEVICE CHECK
Date Time Interrogation Session: 20251006111949
Implantable Lead Connection Status: 753985
Implantable Lead Implant Date: 20230103
Implantable Lead Location: 753860
Implantable Lead Model: 436909
Implantable Lead Serial Number: 81418533
Implantable Pulse Generator Implant Date: 20230103
Pulse Gen Model: 429525
Pulse Gen Serial Number: 84879015

## 2023-10-24 NOTE — Progress Notes (Signed)
 Remote ICD Transmission

## 2023-10-28 ENCOUNTER — Ambulatory Visit: Payer: Self-pay | Admitting: Cardiology

## 2023-10-28 NOTE — Progress Notes (Signed)
 Remote ICD Transmission

## 2023-12-08 ENCOUNTER — Emergency Department (HOSPITAL_BASED_OUTPATIENT_CLINIC_OR_DEPARTMENT_OTHER)
Admission: EM | Admit: 2023-12-08 | Discharge: 2023-12-08 | Disposition: A | Discharge status: Home / Self Care | Attending: Emergency Medicine | Admitting: Emergency Medicine

## 2023-12-08 ENCOUNTER — Emergency Department (HOSPITAL_BASED_OUTPATIENT_CLINIC_OR_DEPARTMENT_OTHER)

## 2023-12-08 ENCOUNTER — Encounter (HOSPITAL_BASED_OUTPATIENT_CLINIC_OR_DEPARTMENT_OTHER): Payer: Self-pay | Admitting: Emergency Medicine

## 2023-12-08 ENCOUNTER — Other Ambulatory Visit: Payer: Self-pay

## 2023-12-08 DIAGNOSIS — S91332A Puncture wound without foreign body, left foot, initial encounter: Secondary | ICD-10-CM | POA: Diagnosis not present

## 2023-12-08 DIAGNOSIS — Z23 Encounter for immunization: Secondary | ICD-10-CM | POA: Diagnosis not present

## 2023-12-08 DIAGNOSIS — Z7982 Long term (current) use of aspirin: Secondary | ICD-10-CM | POA: Insufficient documentation

## 2023-12-08 DIAGNOSIS — S99922A Unspecified injury of left foot, initial encounter: Secondary | ICD-10-CM | POA: Diagnosis present

## 2023-12-08 DIAGNOSIS — W260XXA Contact with knife, initial encounter: Secondary | ICD-10-CM | POA: Diagnosis not present

## 2023-12-08 DIAGNOSIS — I509 Heart failure, unspecified: Secondary | ICD-10-CM | POA: Insufficient documentation

## 2023-12-08 MED ORDER — TETANUS-DIPHTH-ACELL PERTUSSIS 5-2-15.5 LF-MCG/0.5 IM SUSP
0.5000 mL | Freq: Once | INTRAMUSCULAR | Status: AC
  Administered 2023-12-08: 0.5 mL via INTRAMUSCULAR
  Filled 2023-12-08: qty 0.5

## 2023-12-08 MED ORDER — CEPHALEXIN 500 MG PO CAPS: 500.0000 mg | ORAL_CAPSULE | Freq: Four times a day (QID) | ORAL | 0 refills | Status: AC

## 2023-12-08 MED ORDER — ACETAMINOPHEN 500 MG PO TABS
1000.0000 mg | ORAL_TABLET | Freq: Once | ORAL | Status: AC
  Administered 2023-12-08: 1000 mg via ORAL
  Filled 2023-12-08: qty 2

## 2023-12-08 NOTE — Discharge Instructions (Signed)
 You received skin glue which is water resistant, you do not need to do any special wound care, it is okay to leave it uncovered when you shower.  If the glue comes off in sooner than 1 week I would clean the wound gently once daily with Dial soap, water, and replace the bandage.  Please take the entire course of antibiotics that prescribed.

## 2023-12-08 NOTE — ED Provider Notes (Signed)
 [place] EMERGENCY DEPARTMENT AT [facility] Provider Note   CSN: [id number] Arrival date & time: 12/08/23  1902     Patient presents with: Puncture Wound   Dakota Weber is a 38 y.o. male with overall noncontributory past medical history, does have history of CHF, V. tach, depression, not up-to-date on tetanus who presents concern for laceration, puncture wound to left foot.  Knife landed point down on dorsal aspect of left foot.  He endorses some swelling, pain.  Denies any other injury.  He thinks that it was a clean knife.   HPI     Prior to Admission medications   Medication Sig Start Date End Date Taking? Authorizing Provider  cephALEXin  (KEFLEX ) 500 MG capsule Take 1 capsule (500 mg total) by mouth 4 (four) times daily. 12/08/23  Yes Jakirah Zaun H, PA-C  aspirin  EC 81 MG tablet Take 1 tablet (81 mg total) by mouth daily. Swallow whole. 10/31/21   Massengill, Rankin, MD  atorvastatin  (LIPITOR) 40 MG tablet Take 40 mg by mouth daily. 01/04/22   [provider]  empagliflozin  (JARDIANCE ) 10 MG TABS tablet Take 1 tablet (10 mg total) by mouth daily. 01/17/21   Henry Manuelita NOVAK, NP  ibuprofen  (ADVIL ) 600 MG tablet Take 1 tablet (600 mg total) by mouth every 6 (six) hours as needed. 04/12/23   Dasie Faden, MD  losartan  (COZAAR ) 25 MG tablet Take 1 tablet (25 mg total) by mouth daily. 08/01/22   Aniceto Daphne CROME, NP  methocarbamol  (ROBAXIN ) 500 MG tablet Take 1 tablet (500 mg total) by mouth 2 (two) times daily. 04/12/23   Dasie Faden, MD  metoprolol  succinate (TOPROL -XL) 50 MG 24 hr tablet Take 1 tablet (50 mg total) by mouth 2 (two) times daily. Take with or immediately following a meal. 07/09/22   Cindie Ole DASEN, MD  Multiple Vitamin (MULTIVITAMIN) capsule Take 1 capsule by mouth daily.    [provider]    Allergies: Ciprofloxacin    Review of Systems  All other systems reviewed and are negative.   Updated Vital Signs BP 131/87    Pulse 66   Temp 98.5 F (36.9 C) (Oral)   Resp 16   Ht 6' 1 (1.854 m)   Wt 108.9 kg   SpO2 96%   BMI 31.66 kg/m   Physical Exam Vitals and nursing note reviewed.  Constitutional:      General: He is not in acute distress.    Appearance: Normal appearance.  HENT:     Head: Normocephalic and atraumatic.  Eyes:     General:        Right eye: No discharge.        Left eye: No discharge.  Cardiovascular:     Rate and Rhythm: Normal rate and regular rhythm.     Pulses: Normal pulses.  Pulmonary:     Effort: Pulmonary effort is normal. No respiratory distress.  Musculoskeletal:        General: No deformity.     Comments: Normal strength to flexion, extension of the distal toes although patient does have some pain with this motion.  Skin:    General: Skin is warm and dry.     Comments: Small approximately 2.5 cm puncture wound on the dorsum of the left foot, no significant dehiscence.  No evidence of tendon injury, no active bleeding until manipulation.  Neurological:     Mental Status: He is alert and oriented to person, place, and time.  Psychiatric:  Mood and Affect: Mood normal.        Behavior: Behavior normal.     (all labs ordered are listed, but only abnormal results are displayed) Labs Reviewed - No data to display  EKG: None  Radiology: DG Foot Complete Left Result Date: 12/08/2023 EXAM: 3 OR MORE VIEW(S) XRAY OF THE LEFT FOOT 12/08/2023 08:14:00 PM COMPARISON: None available. CLINICAL HISTORY: Puncture wound Puncture wound FINDINGS: BONES AND JOINTS: No acute fracture. No focal osseous lesion. No joint dislocation. SOFT TISSUES: The soft tissues are unremarkable. IMPRESSION: 1. No significant abnormality. Electronically signed by: Franky Crease MD 12/08/2023 08:15 PM EST RP Workstation: [id number]     Procedures   Medications Ordered in the ED  Tdap (ADACEL ) injection 0.5 mL (0.5 mLs Intramuscular Given 12/08/23 1953)  acetaminophen  (TYLENOL ) tablet  1,000 mg (1,000 mg Oral Given 12/08/23 1958)                                    Medical Decision Making Amount and/or Complexity of Data Reviewed Radiology: ordered.  Risk OTC drugs. Prescription drug management.   This is an overall well-appearing 38 yo male who presents with a laceration (puncture wound) to the dorsum of the left foot.  The wound was explored through full range of motion, there is no evidence of foreign body, fascia rupture, damage to the deep vessels, capillary refill is intact distal to the site of the laceration.  Patient is not up-to-date on their tetanus, tetanus updated today.  They are neurovascularly intact throughout on my exam, intact strength of the affected extremity    Wound was extensively cleaned, and repaired as described above.  Patient tolerated the procedure without difficulty.   Patient instructed to monitor for signs of infection including worsening pain, redness, pus draining from the affected site.  Instructed to keep the wound clean, bandage, and change the bandage at least once daily.  Patient understands and agrees to the plan, and is discharged in stable condition at this time.  Given nature of puncture wound patient requested postop shoe for comfort, think this is reasonable.  Again because of puncture wound despite overall clean knife per patient will place on prophylactic antibiotics to prevent infection.  Final diagnoses:  Puncture wound of left foot, initial encounter    ED Discharge Orders          Ordered    cephALEXin  (KEFLEX ) 500 MG capsule  4 times daily        12/08/23 2059               Rosan Sherlean VEAR DEVONNA 12/08/23 2102    Zackowski, Scott, MD 12/08/23 2303

## 2023-12-08 NOTE — ED Triage Notes (Signed)
 Dropped chef knife. Landed point down on dorsal aspect of left foot. Bleeding controlled with bandage. Occurred at 1300 today. Came in to ER for swelling of foot.   Tetanus not UTD

## 2023-12-08 NOTE — ED Notes (Signed)
 Dermabond at bedside for ED PA

## 2024-01-20 ENCOUNTER — Ambulatory Visit

## 2024-01-20 DIAGNOSIS — I428 Other cardiomyopathies: Secondary | ICD-10-CM | POA: Diagnosis not present

## 2024-01-21 ENCOUNTER — Ambulatory Visit: Payer: Self-pay | Admitting: Student in an Organized Health Care Education/Training Program

## 2024-01-21 LAB — CUP PACEART REMOTE DEVICE CHECK
Date Time Interrogation Session: 20260105091551
Implantable Lead Connection Status: 753985
Implantable Lead Implant Date: 20230103
Implantable Lead Location: 753860
Implantable Lead Model: 436909
Implantable Lead Serial Number: 81418533
Implantable Pulse Generator Implant Date: 20230103
Pulse Gen Model: 429525
Pulse Gen Serial Number: 84879015

## 2024-01-23 NOTE — Progress Notes (Signed)
 Remote ICD Transmission

## 2024-04-20 ENCOUNTER — Encounter

## 2024-07-20 ENCOUNTER — Encounter
